# Patient Record
Sex: Female | Born: 1985 | Race: Black or African American | Hispanic: No | Smoking: Former smoker
Health system: Southern US, Community
[De-identification: ages and names within clinical notes are randomized; demographics above are authoritative.]

## PROBLEM LIST (undated history)

## (undated) DIAGNOSIS — D573 Sickle-cell trait: Secondary | ICD-10-CM

## (undated) DIAGNOSIS — R87629 Unspecified abnormal cytological findings in specimens from vagina: Secondary | ICD-10-CM

## (undated) DIAGNOSIS — I82409 Acute embolism and thrombosis of unspecified deep veins of unspecified lower extremity: Secondary | ICD-10-CM

## (undated) HISTORY — PX: MULTIPLE TOOTH EXTRACTIONS: SHX2053

## (undated) HISTORY — DX: Unspecified abnormal cytological findings in specimens from vagina: R87.629

## (undated) HISTORY — PX: WISDOM TOOTH EXTRACTION: SHX21

## (undated) HISTORY — DX: Sickle-cell trait: D57.3

---
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
## 2004-01-13 ENCOUNTER — Inpatient Hospital Stay (HOSPITAL_COMMUNITY): Admission: AD | Admit: 2004-01-13 | Discharge: 2004-01-13 | Payer: Self-pay | Admitting: Obstetrics & Gynecology

## 2004-03-06 ENCOUNTER — Ambulatory Visit (HOSPITAL_COMMUNITY): Admission: RE | Admit: 2004-03-06 | Discharge: 2004-03-06 | Payer: Self-pay | Admitting: *Deleted

## 2004-07-02 ENCOUNTER — Inpatient Hospital Stay (HOSPITAL_COMMUNITY): Admission: AD | Admit: 2004-07-02 | Discharge: 2004-07-02 | Payer: Self-pay | Admitting: *Deleted

## 2004-07-21 ENCOUNTER — Inpatient Hospital Stay (HOSPITAL_COMMUNITY): Admission: AD | Admit: 2004-07-21 | Discharge: 2004-07-21 | Payer: Self-pay | Admitting: Obstetrics & Gynecology

## 2004-07-21 ENCOUNTER — Ambulatory Visit: Payer: Self-pay | Admitting: Family Medicine

## 2004-08-06 ENCOUNTER — Ambulatory Visit: Payer: Self-pay | Admitting: *Deleted

## 2004-08-06 ENCOUNTER — Ambulatory Visit (HOSPITAL_COMMUNITY): Admission: RE | Admit: 2004-08-06 | Discharge: 2004-08-06 | Payer: Self-pay | Admitting: Obstetrics & Gynecology

## 2004-08-08 ENCOUNTER — Ambulatory Visit: Payer: Self-pay | Admitting: *Deleted

## 2004-08-08 ENCOUNTER — Inpatient Hospital Stay (HOSPITAL_COMMUNITY): Admission: AD | Admit: 2004-08-08 | Discharge: 2004-08-12 | Payer: Self-pay | Admitting: Family Medicine

## 2004-08-15 ENCOUNTER — Inpatient Hospital Stay (HOSPITAL_COMMUNITY): Admission: AD | Admit: 2004-08-15 | Discharge: 2004-08-15 | Payer: Self-pay | Admitting: Obstetrics and Gynecology

## 2004-08-18 ENCOUNTER — Inpatient Hospital Stay (HOSPITAL_COMMUNITY): Admission: AD | Admit: 2004-08-18 | Discharge: 2004-08-18 | Payer: Self-pay | Admitting: Obstetrics and Gynecology

## 2004-08-24 ENCOUNTER — Inpatient Hospital Stay (HOSPITAL_COMMUNITY): Admission: AD | Admit: 2004-08-24 | Discharge: 2004-08-24 | Payer: Self-pay | Admitting: *Deleted

## 2005-01-10 ENCOUNTER — Emergency Department (HOSPITAL_COMMUNITY): Admission: EM | Admit: 2005-01-10 | Discharge: 2005-01-10 | Payer: Self-pay | Admitting: Emergency Medicine

## 2005-01-30 ENCOUNTER — Emergency Department (HOSPITAL_COMMUNITY): Admission: EM | Admit: 2005-01-30 | Discharge: 2005-01-30 | Payer: Self-pay | Admitting: Emergency Medicine

## 2006-12-10 ENCOUNTER — Emergency Department (HOSPITAL_COMMUNITY): Admission: EM | Admit: 2006-12-10 | Discharge: 2006-12-10 | Payer: Self-pay | Admitting: Emergency Medicine

## 2006-12-13 ENCOUNTER — Emergency Department (HOSPITAL_COMMUNITY): Admission: EM | Admit: 2006-12-13 | Discharge: 2006-12-13 | Payer: Self-pay | Admitting: Emergency Medicine

## 2007-11-03 ENCOUNTER — Emergency Department (HOSPITAL_COMMUNITY): Admission: EM | Admit: 2007-11-03 | Discharge: 2007-11-03 | Payer: Self-pay | Admitting: Emergency Medicine

## 2007-11-04 ENCOUNTER — Emergency Department (HOSPITAL_COMMUNITY): Admission: EM | Admit: 2007-11-04 | Discharge: 2007-11-04 | Payer: Self-pay | Admitting: Emergency Medicine

## 2008-09-30 ENCOUNTER — Ambulatory Visit: Payer: Self-pay | Admitting: Internal Medicine

## 2008-10-17 ENCOUNTER — Ambulatory Visit: Payer: Self-pay | Admitting: Internal Medicine

## 2008-11-15 ENCOUNTER — Ambulatory Visit: Payer: Self-pay | Admitting: Gastroenterology

## 2009-01-27 ENCOUNTER — Emergency Department (HOSPITAL_COMMUNITY): Admission: EM | Admit: 2009-01-27 | Discharge: 2009-01-27 | Payer: Self-pay | Admitting: Emergency Medicine

## 2009-05-17 ENCOUNTER — Emergency Department (HOSPITAL_COMMUNITY): Admission: EM | Admit: 2009-05-17 | Discharge: 2009-05-17 | Payer: Self-pay | Admitting: Emergency Medicine

## 2009-08-31 ENCOUNTER — Emergency Department (HOSPITAL_COMMUNITY): Admission: EM | Admit: 2009-08-31 | Discharge: 2009-09-01 | Payer: Self-pay | Admitting: Emergency Medicine

## 2010-07-08 LAB — URINALYSIS, ROUTINE W REFLEX MICROSCOPIC
Bilirubin Urine: NEGATIVE
Glucose, UA: NEGATIVE mg/dL
Hgb urine dipstick: NEGATIVE
Ketones, ur: NEGATIVE mg/dL
Nitrite: NEGATIVE
Protein, ur: NEGATIVE mg/dL
Specific Gravity, Urine: 1.015 (ref 1.005–1.030)
Urobilinogen, UA: 1 mg/dL (ref 0.0–1.0)
pH: 6 (ref 5.0–8.0)

## 2010-07-08 LAB — POCT PREGNANCY, URINE: Preg Test, Ur: NEGATIVE

## 2010-09-07 NOTE — Discharge Summary (Signed)
Caitlin Patrick, Caitlin Patrick             ACCOUNT NO.:  [id number]   MEDICAL RECORD NO.:  [id number]          PATIENT TYPE:  INP   LOCATION:  9130                          FACILITY:  [facility]   PHYSICIAN:  Rodolph Bong, M.D.     DATE OF BIRTH:  1985/10/22   DATE OF ADMISSION:  08/08/2004  DATE OF DISCHARGE:  08/12/2004                                 DISCHARGE SUMMARY   DISCHARGE DIAGNOSES:  1.  A 25 year old G1 status post low transverse cesarean section secondary      to arrest of labor.  2.  Viable female infant.   MEDICATIONS:  1.  Ibuprofen 600 mg 1 tablet p.o. q. 6 hours p.r.n.  2.  Percocet 5/325 1 to 2 tablets p.o. q. 6 hours p.r.n. more severe pain.  3.  Depo-Provera injection q. 3 months.  4.  Prenatal vitamins daily.  5.  Ferrous sulfate 325 mg tablets p.o. q.d.   HISTORY OF PRESENT ILLNESS:  Ms. Caitlin Patrick is a 25 year old G1 who presented  at 40-5/[redacted] weeks gestation after noting non-reassuring stress test at [facility] In the Maternity Admissions Unit, an ultrasound was performed that  showed an AFI of 2.4. Consequently, she was admitted for induction of labor  secondary to oligohydramnios.   HOSPITAL COURSE:  Pregnancy:  Ms. Caitlin Patrick was admitted with a history as  outlined above. Initially, her cervix was 140 and high. She was admitted and  placed on cervical ripening therapy. This was attempted with a Foley bulb  and low dose Pitocin. Her contraction pattern picked up and she progressed  in terms of cervical dilation. Due to a slightly elevated blood pressure,  pregnancy induced hypertension labs were checked and are negative. She  continued to be followed expectantly but unfortunately had a prolonged  second stage of labor. Consequently on August 09, 2004 at 2035 secondary to  arrest of labor, she had a low transverse cesarean section. This is dictated  in a separate operative note. She tolerated the procedure well without  complications. In the postpartum period, she did  well. And on the 3rd day,  she was ambulating, tolerating solid foods, and passing flatus. She will be  discharged home for followup at [facility].   DISCHARGE DIET:  Regular.   ACTIVITY:  She is on pelvic rest for 6 weeks.   FOLLOW UP:  Return to [facility] in 6 weeks.   DISCHARGE LABORATORY DATA:  Hemoglobin and hematocrit 8.1 and 24.1 on August 10, 2004.    AK/MEDQ  D:  08/12/2004  T:  08/13/2004  Job:  [id number]

## 2010-09-07 NOTE — Op Note (Signed)
Caitlin Patrick, Caitlin Patrick             ACCOUNT NO.:  [id number]   MEDICAL RECORD NO.:  [id number]          PATIENT TYPE:  INP   LOCATION:  9130                          FACILITY:  [facility]   PHYSICIAN:  Conni Elliot, M.D.DATE OF BIRTH:  1986/01/10   DATE OF PROCEDURE:  08/09/2004  DATE OF DISCHARGE:                                 OPERATIVE REPORT   PREOPERATIVE DIAGNOSIS:  Arrest of labor.   POSTOPERATIVE DIAGNOSIS:  Arrest of labor.   OPERATION:  Low transverse cesarean delivery.   OPERATOR:  Conni Elliot, M.D.   ANESTHESIA:  Continuous lumbar epidural.   OPERATIVE FINDINGS:  A 7 pound 12 ounce female with Apgars of 8 and 8, cord pH  of 7.27.   PROCEDURE:  The patient was placed in the supine left lateral tilt position  and was prepped and draped in a sterile fashion.  A low transverse  Pfannenstiel incision was made through the skin and fascia, the rectus  muscles separated in the midline, the peritoneal cavity entered, a bladder  flap created, a low transverse uterine incision was made.  The baby was  delivered from a vertex presentation with the assistance of a vacuum with  one pull.  The cord doubly clamped and cut, baby handed to the neonatologist  in attendance.  __________.  There was uterine atony, and the patient  received 250 mcg of Hemabate intramuscularly as well as 10 units of Pitocin  intramyometrially.  The uterus, bladder flap, anterior peritoneum, fascia,  subcutaneous tissue and skin were closed in the usual fashion.  Estimated  blood loss approximately 800 mL without replacement.  Sponge, needle and  instrument count was correct.      ASG/MEDQ  D:  08/09/2004  T:  08/10/2004  Job:  [id number]

## 2011-01-17 LAB — URINE MICROSCOPIC-ADD ON

## 2011-01-17 LAB — URINALYSIS, ROUTINE W REFLEX MICROSCOPIC
Bilirubin Urine: NEGATIVE
Glucose, UA: NEGATIVE
Ketones, ur: NEGATIVE
Nitrite: NEGATIVE
Protein, ur: NEGATIVE
Specific Gravity, Urine: 1.015
Urobilinogen, UA: 0.2
pH: 5.5

## 2011-01-17 LAB — PREGNANCY, URINE: Preg Test, Ur: NEGATIVE

## 2011-02-01 LAB — POCT I-STAT CREATININE
Creatinine, Ser: 0.7
Operator id: 151321

## 2011-02-01 LAB — I-STAT 8, (EC8 V) (CONVERTED LAB)
Acid-Base Excess: 2
BUN: 4 — ABNORMAL LOW
Bicarbonate: 23.8
Chloride: 108
Glucose, Bld: 84
HCT: 43
Hemoglobin: 14.6
Operator id: 151321
Potassium: 3.5
Sodium: 138
TCO2: 25
pCO2, Ven: 29.5 — ABNORMAL LOW
pH, Ven: 7.515 — ABNORMAL HIGH

## 2011-02-01 LAB — URINALYSIS, ROUTINE W REFLEX MICROSCOPIC
Bilirubin Urine: NEGATIVE
Glucose, UA: NEGATIVE
Glucose, UA: NEGATIVE
Hgb urine dipstick: NEGATIVE
Ketones, ur: NEGATIVE
Ketones, ur: NEGATIVE
Nitrite: NEGATIVE
Nitrite: POSITIVE — AB
Protein, ur: NEGATIVE
Protein, ur: NEGATIVE
Specific Gravity, Urine: 1.012
Urobilinogen, UA: 1
pH: 6

## 2011-02-01 LAB — RAPID URINE DRUG SCREEN, HOSP PERFORMED
Barbiturates: NOT DETECTED
Cocaine: NOT DETECTED
Opiates: NOT DETECTED
Tetrahydrocannabinol: POSITIVE — AB

## 2011-02-01 LAB — RAPID STREP SCREEN (MED CTR MEBANE ONLY)
Streptococcus, Group A Screen (Direct): NEGATIVE
Streptococcus, Group A Screen (Direct): NEGATIVE

## 2011-02-01 LAB — URINE CULTURE
Colony Count: >=100000
Colony Count: >=100000

## 2011-02-01 LAB — STREP A DNA PROBE: Group A Strep Probe: NEGATIVE

## 2011-02-01 LAB — POCT PREGNANCY, URINE
Operator id: 151321
Preg Test, Ur: NEGATIVE

## 2011-02-01 LAB — URINE MICROSCOPIC-ADD ON

## 2011-02-01 LAB — MONONUCLEOSIS SCREEN: Mono Screen: NEGATIVE

## 2013-02-23 ENCOUNTER — Ambulatory Visit: Payer: Self-pay | Admitting: Advanced Practice Midwife

## 2013-04-23 ENCOUNTER — Encounter: Payer: Self-pay | Admitting: Advanced Practice Midwife

## 2013-04-23 ENCOUNTER — Ambulatory Visit (INDEPENDENT_AMBULATORY_CARE_PROVIDER_SITE_OTHER): Payer: Medicaid Other | Admitting: Advanced Practice Midwife

## 2013-04-23 VITALS — BP 115/75 | HR 78 | Temp 98.0°F | Ht 70.0 in | Wt 220.0 lb

## 2013-04-23 DIAGNOSIS — Z6831 Body mass index (BMI) 31.0-31.9, adult: Secondary | ICD-10-CM

## 2013-04-23 DIAGNOSIS — Z0000 Encounter for general adult medical examination without abnormal findings: Secondary | ICD-10-CM

## 2013-04-23 DIAGNOSIS — Z113 Encounter for screening for infections with a predominantly sexual mode of transmission: Secondary | ICD-10-CM

## 2013-04-23 DIAGNOSIS — Z833 Family history of diabetes mellitus: Secondary | ICD-10-CM

## 2013-04-23 LAB — POCT URINALYSIS DIPSTICK
Bilirubin, UA: NEGATIVE
Blood, UA: NEGATIVE
KETONES UA: NEGATIVE
LEUKOCYTES UA: NEGATIVE
Nitrite, UA: NEGATIVE
Spec Grav, UA: 1.01
UROBILINOGEN UA: NEGATIVE
pH, UA: 7

## 2013-04-23 LAB — COMPREHENSIVE METABOLIC PANEL
ALK PHOS: 65 U/L (ref 39–117)
AST: 11 U/L (ref 0–37)
Albumin: 3.9 g/dL (ref 3.5–5.2)
BILIRUBIN TOTAL: 0.3 mg/dL (ref 0.3–1.2)
BUN: 6 mg/dL (ref 6–23)
CO2: 28 mEq/L (ref 19–32)
CREATININE: 0.74 mg/dL (ref 0.50–1.10)
Calcium: 9 mg/dL (ref 8.4–10.5)
Chloride: 105 mEq/L (ref 96–112)
Glucose, Bld: 79 mg/dL (ref 70–99)
Potassium: 4.3 mEq/L (ref 3.5–5.3)
Sodium: 139 mEq/L (ref 135–145)
Total Protein: 6.6 g/dL (ref 6.0–8.3)

## 2013-04-23 LAB — CBC WITH DIFFERENTIAL/PLATELET
BASOS ABS: 0 10*3/uL (ref 0.0–0.1)
BASOS PCT: 0 % (ref 0–1)
EOS ABS: 0.1 10*3/uL (ref 0.0–0.7)
EOS PCT: 1 % (ref 0–5)
HCT: 33.9 % — ABNORMAL LOW (ref 36.0–46.0)
Hemoglobin: 11.5 g/dL — ABNORMAL LOW (ref 12.0–15.0)
Lymphocytes Relative: 35 % (ref 12–46)
Lymphs Abs: 2.7 10*3/uL (ref 0.7–4.0)
MCH: 28 pg (ref 26.0–34.0)
MCHC: 33.9 g/dL (ref 30.0–36.0)
MCV: 82.7 fL (ref 78.0–100.0)
Monocytes Absolute: 0.5 10*3/uL (ref 0.1–1.0)
Monocytes Relative: 7 % (ref 3–12)
Neutro Abs: 4.5 10*3/uL (ref 1.7–7.7)
Neutrophils Relative %: 57 % (ref 43–77)
PLATELETS: 293 10*3/uL (ref 150–400)
RBC: 4.1 MIL/uL (ref 3.87–5.11)
RDW: 14.1 % (ref 11.5–15.5)
WBC: 7.8 10*3/uL (ref 4.0–10.5)

## 2013-04-23 LAB — TSH: TSH: 0.658 u[IU]/mL (ref 0.350–4.500)

## 2013-04-23 LAB — RPR

## 2013-04-23 LAB — HEMOGLOBIN A1C
Hgb A1c MFr Bld: 5.5 % (ref <5.7)
MEAN PLASMA GLUCOSE: 111 mg/dL (ref <117)

## 2013-04-23 NOTE — Progress Notes (Signed)
Pt in office today for annual exam, would like to have STD testing, reports she has new female partner who has herpes, would like to know what precautions she needs to take so she does not contract herpes

## 2013-04-23 NOTE — Progress Notes (Signed)
  Subjective:     Caitlin Patrick is a 28 y.o. female and is here for a comprehensive physical exam. The patient reports no problems.  Patient reports her partner has HSV. She would like more information r/t to this.   Denies other concerns. Reports she has increased vaginal discharge that is different but denies vaginal itching, burning or irritation.   Patient [facility] year [facility].   History   Social History  . Marital Status: Single    Spouse Name: N/A    Number of Children: N/A  . Years of Education: N/A   Occupational History  . Not on file.   Social History Main Topics  . Smoking status: Former Research scientist (life sciences)  . Smokeless tobacco: Never Used  . Alcohol Use: Yes     Comment: socially wine or beer  . Drug Use: Yes    Special: Marijuana     Comment: occasionally  . Sexual Activity: Yes    Partners: Female   Other Topics Concern  . Not on file   Social History Narrative  . No narrative on file   No health maintenance topics applied.  The following portions of the patient's history were reviewed and updated as appropriate: allergies, current medications, past family history, past medical history, past social history, past surgical history and problem list.  Review of Systems A comprehensive review of systems was negative.   Objective:    BP 115/75  Pulse 78  Temp(Src) 98 F (36.7 C)  Ht 5' 10" (1.778 m)  Wt 220 lb (99.791 kg)  BMI 31.57 kg/m2  LMP 04/12/2013 General appearance: alert and cooperative Head: Normocephalic, without obvious abnormality, atraumatic Ears: normal TM's and external ear canals both ears Nose: Nares normal. Septum midline. Mucosa normal. No drainage or sinus tenderness. Throat: lips, mucosa, and tongue normal; teeth and gums normal Neck: no adenopathy, no carotid bruit, no JVD, supple, symmetrical, trachea midline and thyroid not enlarged, symmetric, no tenderness/mass/nodules Back: symmetric, no curvature.  ROM normal. No CVA tenderness. Lungs: clear to auscultation bilaterally Breasts: normal appearance, no masses or tenderness Heart: regular rate and rhythm, S1, S2 normal, no murmur, click, rub or gallop Abdomen: soft, non-tender; bowel sounds normal; no masses,  no organomegaly Pelvic: cervix normal in appearance, external genitalia normal, no adnexal masses or tenderness, no cervical motion tenderness, rectovaginal septum normal, uterus normal size, shape, and consistency and vagina normal without discharge Extremities: extremities normal, atraumatic, no cyanosis or edema Pulses: 2+ and symmetric Skin: Skin color, texture, turgor normal. No rashes or lesions Lymph nodes: Cervical, supraclavicular, and axillary nodes normal. Neurologic: Grossly normal   Wet Prep: negative hyphae, + whiff, clue cells not seen, negative trich   Assessment:    Healthy female exam.  Abnormal vaginal discharge, Affirm pending Patient Active Problem List   Diagnosis Date Noted  . Family history of diabetes mellitus (DM) 04/23/2013  . BMI 31.0-31.9,adult 04/23/2013  Family Hx of BRCA (mother 53 yo)      Plan:     See After Visit Summary for Counseling Recommendations  Affirm pending, reviewed plan in the event it is +. HSV and STI education done. Encouraged patient to RTC fasting for Cholesterol screening Patient to plan early mammogram due to family hx. HgbA1C today.  Patient due for pap 03/2016  45 min spent with patient greater than 80% spent in counseling and coordination of care.

## 2013-04-24 LAB — HEPATITIS B SURFACE ANTIGEN: HEP B S AG: NEGATIVE

## 2013-04-24 LAB — WET PREP BY MOLECULAR PROBE
Candida species: NEGATIVE
GARDNERELLA VAGINALIS: POSITIVE — AB
Trichomonas vaginosis: NEGATIVE

## 2013-04-24 LAB — HIV ANTIBODY (ROUTINE TESTING W REFLEX): HIV: NONREACTIVE

## 2013-04-25 LAB — GC/CHLAMYDIA PROBE AMP
CT PROBE, AMP APTIMA: NEGATIVE
GC PROBE AMP APTIMA: NEGATIVE

## 2013-05-18 ENCOUNTER — Other Ambulatory Visit: Payer: Self-pay | Admitting: *Deleted

## 2013-05-18 DIAGNOSIS — B9689 Other specified bacterial agents as the cause of diseases classified elsewhere: Secondary | ICD-10-CM

## 2013-05-18 DIAGNOSIS — N760 Acute vaginitis: Principal | ICD-10-CM

## 2013-05-18 MED ORDER — METRONIDAZOLE 500 MG PO TABS: 500.0000 mg | ORAL_TABLET | Freq: Two times a day (BID) | ORAL | Status: DC

## 2013-05-19 ENCOUNTER — Encounter: Payer: Self-pay | Admitting: Advanced Practice Midwife

## 2014-12-06 ENCOUNTER — Encounter (HOSPITAL_COMMUNITY): Payer: Self-pay | Admitting: Emergency Medicine

## 2014-12-06 ENCOUNTER — Emergency Department (HOSPITAL_COMMUNITY)
Admission: EM | Admit: 2014-12-06 | Discharge: 2014-12-06 | Disposition: A | Discharge status: Home / Self Care | Payer: Medicaid Other | Attending: Emergency Medicine | Admitting: Emergency Medicine

## 2014-12-06 DIAGNOSIS — Z91040 Latex allergy status: Secondary | ICD-10-CM | POA: Diagnosis not present

## 2014-12-06 DIAGNOSIS — Z862 Personal history of diseases of the blood and blood-forming organs and certain disorders involving the immune mechanism: Secondary | ICD-10-CM | POA: Insufficient documentation

## 2014-12-06 DIAGNOSIS — Y998 Other external cause status: Secondary | ICD-10-CM | POA: Diagnosis not present

## 2014-12-06 DIAGNOSIS — X58XXXA Exposure to other specified factors, initial encounter: Secondary | ICD-10-CM | POA: Diagnosis not present

## 2014-12-06 DIAGNOSIS — Y9289 Other specified places as the place of occurrence of the external cause: Secondary | ICD-10-CM | POA: Insufficient documentation

## 2014-12-06 DIAGNOSIS — Y9342 Activity, yoga: Secondary | ICD-10-CM | POA: Insufficient documentation

## 2014-12-06 DIAGNOSIS — S199XXA Unspecified injury of neck, initial encounter: Secondary | ICD-10-CM | POA: Insufficient documentation

## 2014-12-06 DIAGNOSIS — S46812A Strain of other muscles, fascia and tendons at shoulder and upper arm level, left arm, initial encounter: Secondary | ICD-10-CM

## 2014-12-06 DIAGNOSIS — S29012A Strain of muscle and tendon of back wall of thorax, initial encounter: Secondary | ICD-10-CM | POA: Diagnosis not present

## 2014-12-06 DIAGNOSIS — Z792 Long term (current) use of antibiotics: Secondary | ICD-10-CM | POA: Diagnosis not present

## 2014-12-06 DIAGNOSIS — S299XXA Unspecified injury of thorax, initial encounter: Secondary | ICD-10-CM | POA: Diagnosis present

## 2014-12-06 DIAGNOSIS — Z87891 Personal history of nicotine dependence: Secondary | ICD-10-CM | POA: Insufficient documentation

## 2014-12-06 MED ORDER — NAPROXEN 500 MG PO TABS: 500.0000 mg | ORAL_TABLET | Freq: Two times a day (BID) | ORAL | Status: DC

## 2014-12-06 MED ORDER — CYCLOBENZAPRINE HCL 10 MG PO TABS: 10.0000 mg | ORAL_TABLET | Freq: Two times a day (BID) | ORAL | Status: DC | PRN

## 2014-12-06 NOTE — ED Notes (Addendum)
Pt reports upper back pain that started 3 days ago after doing yoga. Denies any other fall or injury.  Pt denies radiating pain down either arm or pain in her neck.

## 2014-12-06 NOTE — Discharge Instructions (Signed)
Naprosyn for pain and inflammation. Flexeril for muscle spasms. Try heating pad. Roll out gently. Stretch. Follow up with your doctor if not improving. Return if worsening.   Muscle Strain A muscle strain is an injury that occurs when a muscle is stretched beyond its normal length. Usually a small number of muscle fibers are torn when this happens. Muscle strain is rated in degrees. First-degree strains have the least amount of muscle fiber tearing and pain. Second-degree and third-degree strains have increasingly more tearing and pain.  Usually, recovery from muscle strain takes 1-2 weeks. Complete healing takes 5-6 weeks.  CAUSES  Muscle strain happens when a sudden, violent force placed on a muscle stretches it too far. This may occur with lifting, sports, or a fall.  RISK FACTORS Muscle strain is especially common in athletes.  SIGNS AND SYMPTOMS At the site of the muscle strain, there may be:  Pain.  Bruising.  Swelling.  Difficulty using the muscle due to pain or lack of normal function. DIAGNOSIS  Your health care provider will perform a physical exam and ask about your medical history. TREATMENT  Often, the best treatment for a muscle strain is resting, icing, and applying cold compresses to the injured area.  HOME CARE INSTRUCTIONS   Use the PRICE method of treatment to promote muscle healing during the first 2-3 days after your injury. The PRICE method involves:  Protecting the muscle from being injured again.  Restricting your activity and resting the injured body part.  Icing your injury. To do this, put ice in a plastic bag. Place a towel between your skin and the bag. Then, apply the ice and leave it on from 15-20 minutes each hour. After the third day, switch to moist heat packs.  Apply compression to the injured area with a splint or elastic bandage. Be careful not to wrap it too tightly. This may interfere with blood circulation or increase swelling.  Elevate the  injured body part above the level of your heart as often as you can.  Only take over-the-counter or prescription medicines for pain, discomfort, or fever as directed by your health care provider.  Warming up prior to exercise helps to prevent future muscle strains. SEEK MEDICAL CARE IF:   You have increasing pain or swelling in the injured area.  You have numbness, tingling, or a significant loss of strength in the injured area. MAKE SURE YOU:   Understand these instructions.  Will watch your condition.  Will get help right away if you are not doing well or get worse. Document Released: 04/08/2005 Document Revised: 01/27/2013 Document Reviewed: 11/05/2012 [facility] Patient Information 2015 [place], [place]. This information is not intended to replace advice given to you by your health care provider. Make sure you discuss any questions you have with your health care provider.

## 2014-12-06 NOTE — ED Provider Notes (Signed)
CSN: [id number]     Arrival date & time 12/06/14  1742 History   This chart was scribed for non-physician practitioner Freddie Apley, PA-C working with Leo Grosser, MD by Hilda Lias, ED Scribe. This patient was seen in room WTR6/WTR6 and the patient's care was started at 7:01 PM.  Chief Complaint  Patient presents with  . Back Pain      The history is provided by the patient. No language interpreter was used.     HPI Comments: Caitlin Patrick is a 29 y.o. female who presents to the Emergency Department complaining of constant worsening bilateral upper back pain that has been present for 3 days after pt did yoga twice. Pt states she began doing yoga three days ago, and states she had pain while moving her neck to the left, right, and downward afterwards. Pt states that she did yoga again yesterday and her pain worsened with movement. Pt denies any radiating pain, and denies numbness or weakness. Pt denies taking medications to treat her pain.     Past Medical History  Diagnosis Date  . Sickle cell trait    Past Surgical History  Procedure Laterality Date  . Wisdom tooth extraction    . Multiple tooth extractions Left    Family History  Problem Relation Age of Onset  . Breast cancer Mother   . Hypertension Father   . Diabetes Maternal Grandmother   . Diabetes Paternal Grandmother    Social History  Substance Use Topics  . Smoking status: Former Research scientist (life sciences)  . Smokeless tobacco: Never Used  . Alcohol Use: Yes     Comment: socially wine or beer   OB History    No data available     Review of Systems  Constitutional: Positive for activity change.  Musculoskeletal: Positive for myalgias, back pain and neck pain.  Neurological: Negative for weakness and numbness.      Allergies  Latex  Home Medications   Prior to Admission medications   Medication Sig Start Date End Date Taking? Authorizing Provider  ibuprofen (ADVIL,MOTRIN) 600 MG tablet Take 600 mg by mouth  every 6 (six) hours as needed.    Historical Provider, MD  metroNIDAZOLE (FLAGYL) 500 MG tablet Take 1 tablet (500 mg total) by mouth 2 (two) times daily. 05/18/13   Amy H Wren, CNM   BP 130/81 mmHg  Pulse 75  Temp(Src) 98.9 F (37.2 C) (Oral)  Resp 18  SpO2 96%  LMP 12/05/2014 Physical Exam  Constitutional: She appears well-developed and well-nourished. No distress.  HENT:  Head: Normocephalic.  Eyes: Conjunctivae are normal.  Neck: Neck supple.  No midline cervical spine tenderness. Full range of motion of the neck.  Cardiovascular: Normal rate, regular rhythm and normal heart sounds.   Pulmonary/Chest: Effort normal and breath sounds normal. No respiratory distress. She has no wheezes. She has no rales.  Musculoskeletal: She exhibits no edema.  Tenderness to palpation over left trapezius muscle extending from base of the neck into the left shoulder and down over medial aspect of the scapula. Pain with range of motion of the left arm. No bruising, swelling noted to the area.  Neurological: She is alert.  5/5 and equal strength of upper extremities, specifically, biceps, triceps, deltoids, grip. Sensation is intact and equal bilaterally in upper extremities. Gait is normal.  Skin: Skin is warm and dry.  Psychiatric: She has a normal mood and affect. Her behavior is normal.  Nursing note and vitals reviewed.   ED Course  Procedures (including critical care time)  DIAGNOSTIC STUDIES: Oxygen Saturation is 96% on room air, normal by my interpretation.    COORDINATION OF CARE: 7:05 PM Discussed treatment plan with pt at bedside and pt agreed to plan.   Labs Review Labs Reviewed - No data to display  Imaging Review No results found. .   EKG Interpretation None      MDM   Final diagnoses:  Trapezius strain, left, initial encounter    patient with pain and tenderness to palpation over left trapezius muscle. No bruising, swelling, erythema. Muscle is tender to palpation,  slightly muscular injury. She has no weakness of her extremities. Slightly muscular strain. Plan to discharge home, naproxen, Flexeril, follow up with primary care doctor. Advised heating pads and stretches. Follow with primary care doctor.  Filed Vitals:   12/06/14 1810  BP: 130/81  Pulse: 75  Temp: 98.9 F (37.2 C)  Resp: 18    I personally performed the services described in this documentation, which was scribed in my presence. The recorded information has been reviewed and is accurate.    Jeannett Senior, PA-C 12/06/14 2039  Leo Grosser, MD 12/07/14 [phone]

## 2015-08-07 ENCOUNTER — Emergency Department (HOSPITAL_COMMUNITY)
Admission: EM | Admit: 2015-08-07 | Discharge: 2015-08-07 | Disposition: A | Discharge status: Home / Self Care | Payer: Medicaid Other | Attending: Emergency Medicine | Admitting: Emergency Medicine

## 2015-08-07 ENCOUNTER — Encounter (HOSPITAL_COMMUNITY): Payer: Self-pay

## 2015-08-07 DIAGNOSIS — Z3202 Encounter for pregnancy test, result negative: Secondary | ICD-10-CM | POA: Insufficient documentation

## 2015-08-07 DIAGNOSIS — Z91040 Latex allergy status: Secondary | ICD-10-CM | POA: Insufficient documentation

## 2015-08-07 DIAGNOSIS — Z862 Personal history of diseases of the blood and blood-forming organs and certain disorders involving the immune mechanism: Secondary | ICD-10-CM | POA: Diagnosis not present

## 2015-08-07 DIAGNOSIS — R1012 Left upper quadrant pain: Secondary | ICD-10-CM

## 2015-08-07 DIAGNOSIS — R112 Nausea with vomiting, unspecified: Secondary | ICD-10-CM | POA: Diagnosis not present

## 2015-08-07 DIAGNOSIS — R1084 Generalized abdominal pain: Secondary | ICD-10-CM | POA: Diagnosis not present

## 2015-08-07 DIAGNOSIS — R197 Diarrhea, unspecified: Secondary | ICD-10-CM | POA: Diagnosis not present

## 2015-08-07 DIAGNOSIS — R1032 Left lower quadrant pain: Secondary | ICD-10-CM | POA: Diagnosis present

## 2015-08-07 DIAGNOSIS — Z87891 Personal history of nicotine dependence: Secondary | ICD-10-CM | POA: Insufficient documentation

## 2015-08-07 LAB — I-STAT BETA HCG BLOOD, ED (MC, WL, AP ONLY): I-stat hCG, quantitative: <5 m[IU]/mL (ref <5)

## 2015-08-07 LAB — URINALYSIS, ROUTINE W REFLEX MICROSCOPIC
BILIRUBIN URINE: NEGATIVE
Glucose, UA: NEGATIVE mg/dL
Hgb urine dipstick: NEGATIVE
KETONES UR: NEGATIVE mg/dL
Leukocytes, UA: NEGATIVE
NITRITE: NEGATIVE
PH: 7 (ref 5.0–8.0)
Protein, ur: NEGATIVE mg/dL
Specific Gravity, Urine: 1.015 (ref 1.005–1.030)

## 2015-08-07 LAB — COMPREHENSIVE METABOLIC PANEL
ALBUMIN: 4.3 g/dL (ref 3.5–5.0)
ALK PHOS: 72 U/L (ref 38–126)
ALT: 29 U/L (ref 14–54)
AST: 21 U/L (ref 15–41)
Anion gap: 8 (ref 5–15)
BILIRUBIN TOTAL: 0.4 mg/dL (ref 0.3–1.2)
BUN: 9 mg/dL (ref 6–20)
CALCIUM: 9.1 mg/dL (ref 8.9–10.3)
CO2: 24 mmol/L (ref 22–32)
CREATININE: 0.67 mg/dL (ref 0.44–1.00)
Chloride: 106 mmol/L (ref 101–111)
GFR calc Af Amer: >60 mL/min (ref >60)
GFR calc non Af Amer: >60 mL/min (ref >60)
GLUCOSE: 98 mg/dL (ref 65–99)
Potassium: 3.4 mmol/L — ABNORMAL LOW (ref 3.5–5.1)
Sodium: 138 mmol/L (ref 135–145)
TOTAL PROTEIN: 8 g/dL (ref 6.5–8.1)

## 2015-08-07 LAB — CBC
HEMATOCRIT: 34.8 % — AB (ref 36.0–46.0)
Hemoglobin: 12.2 g/dL (ref 12.0–15.0)
MCH: 28.5 pg (ref 26.0–34.0)
MCHC: 35.1 g/dL (ref 30.0–36.0)
MCV: 81.3 fL (ref 78.0–100.0)
Platelets: 276 10*3/uL (ref 150–400)
RBC: 4.28 MIL/uL (ref 3.87–5.11)
RDW: 13.7 % (ref 11.5–15.5)
WBC: 8.1 10*3/uL (ref 4.0–10.5)

## 2015-08-07 LAB — LIPASE, BLOOD: Lipase: 22 U/L (ref 11–51)

## 2015-08-07 MED ORDER — SODIUM CHLORIDE 0.9 % IV BOLUS (SEPSIS)
1000.0000 mL | Freq: Once | INTRAVENOUS | Status: AC
  Administered 2015-08-07: 1000 mL via INTRAVENOUS

## 2015-08-07 MED ORDER — ONDANSETRON HCL 4 MG/2ML IJ SOLN
4.0000 mg | Freq: Once | INTRAMUSCULAR | Status: AC
  Administered 2015-08-07: 4 mg via INTRAVENOUS
  Filled 2015-08-07: qty 2

## 2015-08-07 MED ORDER — GI COCKTAIL ~~LOC~~
30.0000 mL | Freq: Once | ORAL | Status: AC
  Administered 2015-08-07: 30 mL via ORAL
  Filled 2015-08-07: qty 30

## 2015-08-07 NOTE — ED Notes (Signed)
Pt c/o intermittent abdominal pain x 6 month increasing x 2-3 days and n/v/d x 2-3 days.  Pain score 7/10.  Pt reports that pain increases w/ eating.

## 2015-08-07 NOTE — ED Notes (Signed)
PT is aware of urine sample 

## 2015-08-07 NOTE — Discharge Instructions (Signed)
Try Zantac 150 mg twice a day for your pain. Follow-up with your family doctor. Since her symptoms be going on for so long they may need you to see a gastroenterologist Abdominal Pain, Adult Many things can cause abdominal pain. Usually, abdominal pain is not caused by a disease and will improve without treatment. It can often be observed and treated at home. Your health care provider will do a physical exam and possibly order blood tests and X-rays to help determine the seriousness of your pain. However, in many cases, more time must pass before a clear cause of the pain can be found. Before that point, your health care provider may not know if you need more testing or further treatment. HOME CARE INSTRUCTIONS Monitor your abdominal pain for any changes. The following actions may help to alleviate any discomfort you are experiencing:  Only take over-the-counter or prescription medicines as directed by your health care provider.  Do not take laxatives unless directed to do so by your health care provider.  Try a clear liquid diet (broth, tea, or water) as directed by your health care provider. Slowly move to a bland diet as tolerated. SEEK MEDICAL CARE IF:  You have unexplained abdominal pain.  You have abdominal pain associated with nausea or diarrhea.  You have pain when you urinate or have a bowel movement.  You experience abdominal pain that wakes you in the night.  You have abdominal pain that is worsened or improved by eating food.  You have abdominal pain that is worsened with eating fatty foods.  You have a fever. SEEK IMMEDIATE MEDICAL CARE IF:  Your pain does not go away within 2 hours.  You keep throwing up (vomiting).  Your pain is felt only in portions of the abdomen, such as the right side or the left lower portion of the abdomen.  You pass bloody or black tarry stools. MAKE SURE YOU:  Understand these instructions.  Will watch your condition.  Will get help  right away if you are not doing well or get worse.   This information is not intended to replace advice given to you by your health care provider. Make sure you discuss any questions you have with your health care provider.   Document Released: 01/16/2005 Document Revised: 12/28/2014 Document Reviewed: 12/16/2012 Elsevier Interactive Patient Education [employer].

## 2015-08-07 NOTE — ED Provider Notes (Signed)
CSN: [id number]     Arrival date & time 08/07/15  0831 History   First MD Initiated Contact with Patient 08/07/15 [phone]     Chief Complaint  Patient presents with  . Abdominal Pain  . Emesis  . Diarrhea     (Consider location/radiation/quality/duration/timing/severity/associated sxs/prior Treatment) Patient is a 30 y.o. female presenting with abdominal pain. The history is provided by the patient.  Abdominal Pain Pain location:  LLQ Pain quality: cramping, sharp and shooting   Pain radiates to:  Does not radiate Pain severity:  Moderate Onset quality:  Gradual Duration:  24 weeks Timing:  Constant Progression:  Worsening Chronicity:  New Context: alcohol use and eating   Relieved by:  Nothing Worsened by:  Nothing tried Ineffective treatments:  None tried Associated symptoms: nausea and vomiting   Associated symptoms: no chest pain, no chills, no dysuria, no fever and no shortness of breath    30 yo F With a chief complaint of left lower quadrant pain. This been going on for about 6 months. Crampy comes and goes also described as sharp and severe. Patient has had some nausea vomiting and diarrhea with this as well. She's been feeling more constipated over the past week. Denies any vaginal bleeding vaginal discharge. Denies dysuria. Patient points the pain it's about the left upper quadrant. Pain is worse with eating and drinking alcohol.  Past Medical History  Diagnosis Date  . Sickle cell trait [facility])    Past Surgical History  Procedure Laterality Date  . Wisdom tooth extraction    . Multiple tooth extractions Left   . Cesarean section     Family History  Problem Relation Age of Onset  . Breast cancer Mother   . Hypertension Father   . Diabetes Maternal Grandmother   . Diabetes Paternal Grandmother    Social History  Substance Use Topics  . Smoking status: Former Research scientist (life sciences)  . Smokeless tobacco: Never Used  . Alcohol Use: Yes     Comment: socially wine or beer   OB  History    No data available     Review of Systems  Constitutional: Negative for fever and chills.  HENT: Negative for congestion and rhinorrhea.   Eyes: Negative for redness and visual disturbance.  Respiratory: Negative for shortness of breath and wheezing.   Cardiovascular: Negative for chest pain and palpitations.  Gastrointestinal: Positive for nausea, vomiting and abdominal pain.  Genitourinary: Negative for dysuria and urgency.  Musculoskeletal: Negative for myalgias and arthralgias.  Skin: Negative for pallor and wound.  Neurological: Negative for dizziness and headaches.      Allergies  Latex  Home Medications   Prior to Admission medications   Medication Sig Start Date End Date Taking? Authorizing Provider  bismuth subsalicylate (PEPTO BISMOL) 262 MG chewable tablet Chew 262 mg by mouth as needed for indigestion.   Yes Historical Provider, MD  ibuprofen (ADVIL,MOTRIN) 200 MG tablet Take 200 mg by mouth every 6 (six) hours as needed for headache, moderate pain or cramping.   Yes Historical Provider, MD  naproxen (NAPROSYN) 500 MG tablet Take 1 tablet (500 mg total) by mouth 2 (two) times daily. Patient taking differently: Take 500 mg by mouth 2 (two) times daily as needed for mild pain or moderate pain.  12/06/14  Yes Tatyana Kirichenko, PA-C   BP 123/90 mmHg  Pulse 58  Temp(Src) 98.2 F (36.8 C) (Oral)  Resp 17  SpO2 100%  LMP 07/31/2015 Physical Exam  Constitutional: She is oriented to  person, place, and time. She appears well-developed and well-nourished. No distress.  HENT:  Head: Normocephalic and atraumatic.  Eyes: EOM are normal. Pupils are equal, round, and reactive to light.  Neck: Normal range of motion. Neck supple.  Cardiovascular: Normal rate and regular rhythm.  Exam reveals no gallop and no friction rub.   No murmur heard. Pulmonary/Chest: Effort normal. She has no wheezes. She has no rales.  Abdominal: Soft. She exhibits no distension. There is  tenderness (mild diffuse, negative muprhys). There is no rebound and no guarding.  Musculoskeletal: She exhibits no edema or tenderness.  Neurological: She is alert and oriented to person, place, and time.  Skin: Skin is warm and dry. She is not diaphoretic.  Psychiatric: She has a normal mood and affect. Her behavior is normal.  Nursing note and vitals reviewed.   ED Course  Procedures (including critical care time) Labs Review Labs Reviewed  COMPREHENSIVE METABOLIC PANEL - Abnormal; Notable for the following:    Potassium 3.4 (*)    All other components within normal limits  CBC - Abnormal; Notable for the following:    HCT 34.8 (*)    All other components within normal limits  URINALYSIS, ROUTINE W REFLEX MICROSCOPIC (NOT AT [facility]) - Abnormal; Notable for the following:    APPearance CLOUDY (*)    All other components within normal limits  LIPASE, BLOOD  I-STAT BETA HCG BLOOD, ED ([facility], [facility], [facility] ONLY)    Imaging Review No results found. I have personally reviewed and evaluated these images and lab results as part of my medical decision-making.   EKG Interpretation None      MDM   Final diagnoses:  Left upper quadrant pain    30 yo F with a chief complaint of abdominal pain. Patient symptoms are consistent with GERD versus constipation. Patient has a nonfocal abdomen feel that imaging would not be helpful at this time. Will obtain a laboratory evaluation given a fluid bolus Zofran GI cocktail reassess.  Lab work unremarkable.  D/c home.    I have discussed the diagnosis/risks/treatment options with the patient and family and believe the pt to be eligible for discharge home to follow-up with PCP. We also discussed returning to the ED immediately if new or worsening sx occur. We discussed the sx which are most concerning (e.g., sudden worsening pain, fever, inability to tolerate by mouth) that necessitate immediate return. Medications administered to the patient during their  visit and any new prescriptions provided to the patient are listed below.  Medications given during this visit Medications  sodium chloride 0.9 % bolus 1,000 mL (0 mLs Intravenous Stopped 08/07/15 1110)  ondansetron (ZOFRAN) injection 4 mg (4 mg Intravenous Given 08/07/15 0951)  gi cocktail (Maalox,Lidocaine,Donnatal) (30 mLs Oral Given 08/07/15 0951)    Discharge Medication List as of 08/07/2015 10:58 AM      The patient appears reasonably screen and/or stabilized for discharge and I doubt any other medical condition or other [facility] requiring further screening, evaluation, or treatment in the ED at this time prior to discharge.    Deno Etienne, DO 08/08/15 [phone]

## 2016-03-21 ENCOUNTER — Ambulatory Visit: Payer: Medicaid Other | Admitting: Certified Nurse Midwife

## 2016-07-15 DIAGNOSIS — G43109 Migraine with aura, not intractable, without status migrainosus: Secondary | ICD-10-CM | POA: Insufficient documentation

## 2016-08-17 ENCOUNTER — Encounter (HOSPITAL_COMMUNITY): Payer: Self-pay | Admitting: *Deleted

## 2016-08-17 ENCOUNTER — Emergency Department (HOSPITAL_COMMUNITY)
Admission: EM | Admit: 2016-08-17 | Discharge: 2016-08-17 | Disposition: A | Discharge status: Home / Self Care | Payer: Medicaid Other | Attending: Emergency Medicine | Admitting: Emergency Medicine

## 2016-08-17 DIAGNOSIS — Z79899 Other long term (current) drug therapy: Secondary | ICD-10-CM | POA: Diagnosis not present

## 2016-08-17 DIAGNOSIS — R21 Rash and other nonspecific skin eruption: Secondary | ICD-10-CM | POA: Insufficient documentation

## 2016-08-17 DIAGNOSIS — Z91040 Latex allergy status: Secondary | ICD-10-CM | POA: Diagnosis not present

## 2016-08-17 DIAGNOSIS — Z87891 Personal history of nicotine dependence: Secondary | ICD-10-CM | POA: Diagnosis not present

## 2016-08-17 MED ORDER — PREDNISONE 20 MG PO TABS: 40.0000 mg | ORAL_TABLET | Freq: Every day | ORAL | 0 refills | Status: DC

## 2016-08-17 NOTE — ED Provider Notes (Signed)
[facility] DEPT Provider Note   CSN: [id number] Arrival date & time: 08/17/16  1740 By signing my name below, I, Gaspar Cola, attest that this documentation has been prepared under the direction and in the presence of non physician provider,Stevee Valenta, PA-C. Electronically Signed: Gaspar Cola, Scribe. 08/17/2016. 6:50 PM  History   Chief Complaint Chief Complaint  Patient presents with  . Rash   HPI Caitlin Patrick is a 31 y.o. female who presents to the Emergency Department complaining of a moderate, gradually worsening, erythematous rash onset yesterday. Per pt, her rash began on her left lateral arm and spread to her right arm, back, chest and face. She notes associated mild itching that is exacerbated by scratching the area.  No OTC medications or ointments attempted. No new soaps, lotions, detergents, foods, animals, plants, or medications .Pt denies any drainage, vomiting, fever, difficulty breathing, chest pain, oral lesions, or abdominal pain.  The history is provided by the patient. No language interpreter was used.   Past Medical History:  Diagnosis Date  . Sickle cell trait [facility])     Patient Active Problem List   Diagnosis Date Noted  . Family history of diabetes mellitus (DM) 04/23/2013  . BMI 31.0-31.9,adult 04/23/2013    Past Surgical History:  Procedure Laterality Date  . CESAREAN SECTION    . MULTIPLE TOOTH EXTRACTIONS Left   . WISDOM TOOTH EXTRACTION      OB History    No data available     Home Medications    Prior to Admission medications   Medication Sig Start Date End Date Taking? Authorizing Provider  bismuth subsalicylate (PEPTO BISMOL) 262 MG chewable tablet Chew 262 mg by mouth as needed for indigestion.    Historical Provider, MD  ibuprofen (ADVIL,MOTRIN) 200 MG tablet Take 200 mg by mouth every 6 (six) hours as needed for headache, moderate pain or cramping.    Historical Provider, MD  naproxen (NAPROSYN) 500 MG tablet Take 1  tablet (500 mg total) by mouth 2 (two) times daily. Patient taking differently: Take 500 mg by mouth 2 (two) times daily as needed for mild pain or moderate pain.  12/06/14   Tatyana Kirichenko, PA-C  predniSONE (DELTASONE) 20 MG tablet Take 2 tablets (40 mg total) by mouth daily. 08/17/16   Nona Dell, PA-C   Family History Family History  Problem Relation Age of Onset  . Breast cancer Mother   . Hypertension Father   . Diabetes Maternal Grandmother   . Diabetes Paternal Grandmother     Social History Social History  Substance Use Topics  . Smoking status: Former Research scientist (life sciences)  . Smokeless tobacco: Never Used  . Alcohol use Yes     Comment: socially wine or beer    Allergies   Latex  Review of Systems Review of Systems  Constitutional: Negative for fever.  Respiratory: Negative for shortness of breath.   Cardiovascular: Negative for chest pain.  Gastrointestinal: Negative for vomiting.  Skin: Positive for color change and rash.   Physical Exam Updated Vital Signs BP (!) 123/94   Pulse 97   Temp 98.7 F (37.1 C) (Oral)   Resp 18   LMP 08/10/2016   SpO2 100%   Physical Exam  Constitutional: She is oriented to person, place, and time. She appears well-developed and well-nourished. No distress.  HENT:  Head: Normocephalic and atraumatic.  Mouth/Throat: Uvula is midline, oropharynx is clear and moist and mucous membranes are normal. No oropharyngeal exudate, posterior oropharyngeal edema, posterior oropharyngeal  erythema or tonsillar abscesses. No tonsillar exudate.  No oral lesions   Eyes: Conjunctivae and EOM are normal. Right eye exhibits no discharge. Left eye exhibits no discharge. No scleral icterus.  Neck: Normal range of motion. Neck supple.  Cardiovascular: Normal rate, regular rhythm, normal heart sounds and intact distal pulses.   Pulmonary/Chest: Effort normal and breath sounds normal. No respiratory distress. She has no wheezes. She has no rales. She  exhibits no tenderness.  Abdominal: Soft. Bowel sounds are normal. She exhibits no distension and no mass. There is no tenderness. There is no rebound and no guarding.  Musculoskeletal: Normal range of motion. She exhibits no edema or tenderness.  Lymphadenopathy:    She has no cervical adenopathy.  Neurological: She is alert and oriented to person, place, and time.  Skin: Skin is warm and dry. Rash noted. She is not diaphoretic.  Multiple small scattered erythemetous blanching papules present to arms, back, and upper chest. No vesicles, pustules, bulla, or drainage. No lesions on palms or soles.  Nursing note and vitals reviewed.  ED Treatments / Results   DIAGNOSTIC STUDIES:  Oxygen Saturation is 100% RA, normal by my interpretation.    COORDINATION OF CARE:  6:36 PM Discussed treatment plan with pt at bedside and pt agreed to plan.  Labs (all labs ordered are listed, but only abnormal results are displayed) Labs Reviewed - No data to display  EKG  EKG Interpretation None       Radiology No results found.  Procedures Procedures (including critical care time)  Medications Ordered in ED Medications - No data to display   Initial Impression / Assessment and Plan / ED Course  I have reviewed the triage vital signs and the nursing notes.  Pertinent labs & imaging results that were available during my care of the patient were reviewed by me and considered in my medical decision making (see chart for details).     Rash consistent with mosquito bites vs contact dermatitis. Patient denies any difficulty breathing or swallowing.  Pt has a patent airway without stridor and is handling secretions without difficulty; no angioedema. No blisters, no pustules, no warmth, no draining sinus tracts, no superficial abscesses, no bullous impetigo, no vesicles, no desquamation, no target lesions with dusky purpura or a central bulla. Not tender to touch. No concern for superimposed  infection. No concern for SJS, TEN, TSS, tick borne illness, syphilis or other life-threatening condition. Will discharge home with short course of steroids. Advised to followup with PCP as needed. Discussed return precautions.     Final Clinical Impressions(s) / ED Diagnoses   Final diagnoses:  Rash    New Prescriptions Discharge Medication List as of 08/17/2016  6:42 PM    START taking these medications   Details  predniSONE (DELTASONE) 20 MG tablet Take 2 tablets (40 mg total) by mouth daily., Starting Sat 08/17/2016, Print        I personally performed the services described in this documentation, which was scribed in my presence. The recorded information has been reviewed and is accurate.     Chesley Noon [place], [place] 08/17/16 1854    Daleen Bo, MD 08/18/16 1229

## 2016-08-17 NOTE — ED Notes (Signed)
Declined W/C at D/C and was escorted to lobby by RN. 

## 2016-08-17 NOTE — Discharge Instructions (Signed)
Take your medication as prescribed until completed. You may also apply over-the-counter hydrocortisone cream to your rash as needed for additional itch relief. I recommend following up with your primary care provider in the next 4-5 days if your rash has not improved. Please return to the Emergency Department if symptoms worsen or new onset of fever, oral lesions, difficulty breathing, facial swelling, drainage, new rash, spreading of rash, fluid filled lesions, abdominal pain, vomiting.

## 2016-08-17 NOTE — ED Triage Notes (Signed)
The pt has had a rash since last pm that started on  Her lt upper arm.  No previous history.  No difficulty breathing  lmp last week

## 2016-09-09 ENCOUNTER — Ambulatory Visit: Payer: Medicaid Other | Admitting: Obstetrics and Gynecology

## 2016-09-18 ENCOUNTER — Emergency Department (HOSPITAL_COMMUNITY)
Admission: EM | Admit: 2016-09-18 | Discharge: 2016-09-18 | Disposition: A | Discharge status: Home / Self Care | Payer: Medicaid Other | Attending: Emergency Medicine | Admitting: Emergency Medicine

## 2016-09-18 ENCOUNTER — Encounter (HOSPITAL_COMMUNITY): Payer: Self-pay | Admitting: Emergency Medicine

## 2016-09-18 DIAGNOSIS — Z87891 Personal history of nicotine dependence: Secondary | ICD-10-CM | POA: Insufficient documentation

## 2016-09-18 DIAGNOSIS — R197 Diarrhea, unspecified: Secondary | ICD-10-CM | POA: Diagnosis not present

## 2016-09-18 DIAGNOSIS — R112 Nausea with vomiting, unspecified: Secondary | ICD-10-CM | POA: Insufficient documentation

## 2016-09-18 DIAGNOSIS — Z79899 Other long term (current) drug therapy: Secondary | ICD-10-CM | POA: Insufficient documentation

## 2016-09-18 LAB — CBC
HEMATOCRIT: 37.2 % (ref 36.0–46.0)
HEMOGLOBIN: 13 g/dL (ref 12.0–15.0)
MCH: 29.2 pg (ref 26.0–34.0)
MCHC: 34.9 g/dL (ref 30.0–36.0)
MCV: 83.6 fL (ref 78.0–100.0)
Platelets: 253 10*3/uL (ref 150–400)
RBC: 4.45 MIL/uL (ref 3.87–5.11)
RDW: 13.3 % (ref 11.5–15.5)
WBC: 8.2 10*3/uL (ref 4.0–10.5)

## 2016-09-18 LAB — URINALYSIS, ROUTINE W REFLEX MICROSCOPIC
Bilirubin Urine: NEGATIVE
Glucose, UA: NEGATIVE mg/dL
Hgb urine dipstick: NEGATIVE
Ketones, ur: NEGATIVE mg/dL
LEUKOCYTES UA: NEGATIVE
Nitrite: NEGATIVE
PH: 6 (ref 5.0–8.0)
Protein, ur: NEGATIVE mg/dL
SPECIFIC GRAVITY, URINE: 1.012 (ref 1.005–1.030)

## 2016-09-18 LAB — COMPREHENSIVE METABOLIC PANEL
ALT: 15 U/L (ref 14–54)
ANION GAP: 6 (ref 5–15)
AST: 18 U/L (ref 15–41)
Albumin: 4.3 g/dL (ref 3.5–5.0)
Alkaline Phosphatase: 55 U/L (ref 38–126)
BILIRUBIN TOTAL: 0.9 mg/dL (ref 0.3–1.2)
BUN: 8 mg/dL (ref 6–20)
CO2: 26 mmol/L (ref 22–32)
Calcium: 8.9 mg/dL (ref 8.9–10.3)
Chloride: 107 mmol/L (ref 101–111)
Creatinine, Ser: 0.66 mg/dL (ref 0.44–1.00)
GFR calc Af Amer: >60 mL/min (ref >60)
Glucose, Bld: 106 mg/dL — ABNORMAL HIGH (ref 65–99)
Potassium: 3.7 mmol/L (ref 3.5–5.1)
Sodium: 139 mmol/L (ref 135–145)
TOTAL PROTEIN: 8 g/dL (ref 6.5–8.1)

## 2016-09-18 LAB — PREGNANCY, URINE: Preg Test, Ur: NEGATIVE

## 2016-09-18 LAB — LIPASE, BLOOD: Lipase: 22 U/L (ref 11–51)

## 2016-09-18 MED ORDER — PROMETHAZINE HCL 25 MG PO TABS: 25.0000 mg | ORAL_TABLET | Freq: Four times a day (QID) | ORAL | 0 refills | Status: DC | PRN

## 2016-09-18 MED ORDER — SODIUM CHLORIDE 0.9 % IV BOLUS (SEPSIS)
1000.0000 mL | Freq: Once | INTRAVENOUS | Status: AC
  Administered 2016-09-18: 1000 mL via INTRAVENOUS

## 2016-09-18 MED ORDER — ONDANSETRON HCL 4 MG/2ML IJ SOLN
4.0000 mg | Freq: Once | INTRAMUSCULAR | Status: AC
  Administered 2016-09-18: 4 mg via INTRAVENOUS
  Filled 2016-09-18: qty 2

## 2016-09-18 NOTE — ED Provider Notes (Signed)
[facility] DEPT Provider Note   CSN: [id number] Arrival date & time: 09/18/16  1131     History   Chief Complaint Chief Complaint  Patient presents with  . Emesis    HPI Caitlin Patrick is a 31 y.o. female.  HPI   31 year old obese female with history of sickle cell trait presenting today complaining of nausea and vomiting. Patient reports she ate leftover [employer] cookout food last night and now afterward she developed abdominal cramping. Since then she has had multiple bouts of nonbloody nonbilious vomiting this including nonbloody non-mucousy diarrhea. States that she has to use the bathroom hourly. Aside from mild abdominal cramping, patient states she cannot tolerate anything by mouth. She denies fever, chills, headache, URI symptoms, chest pain, short of breath, back pain, dysuria, hematuria, vaginal bleeding or vaginal discharge. No recent travel, no recent antibody use, no recent sick contact.     Past Medical History:  Diagnosis Date  . Sickle cell trait [facility])     Patient Active Problem List   Diagnosis Date Noted  . Family history of diabetes mellitus (DM) 04/23/2013  . BMI 31.0-31.9,adult 04/23/2013    Past Surgical History:  Procedure Laterality Date  . CESAREAN SECTION    . MULTIPLE TOOTH EXTRACTIONS Left   . WISDOM TOOTH EXTRACTION      OB History    No data available       Home Medications    Prior to Admission medications   Medication Sig Start Date End Date Taking? Authorizing Provider  bismuth subsalicylate (PEPTO BISMOL) 262 MG chewable tablet Chew 262 mg by mouth as needed for indigestion.   Yes [provider]  ibuprofen (ADVIL,MOTRIN) 200 MG tablet Take 200 mg by mouth every 6 (six) hours as needed for headache, moderate pain or cramping.   Yes [provider]  naproxen (NAPROSYN) 500 MG tablet Take 1 tablet (500 mg total) by mouth 2 (two) times daily. Patient not taking: Reported on 09/18/2016 12/06/14   Jeannett Senior, PA-C  predniSONE (DELTASONE) 20 MG tablet Take 2 tablets (40 mg total) by mouth daily. Patient not taking: Reported on 09/18/2016 08/17/16   Nona Dell, PA-C    Family History Family History  Problem Relation Age of Onset  . Breast cancer Mother   . Hypertension Father   . Diabetes Maternal Grandmother   . Diabetes Paternal Grandmother     Social History Social History  Substance Use Topics  . Smoking status: Former Research scientist (life sciences)  . Smokeless tobacco: Never Used  . Alcohol use Yes     Comment: socially wine or beer     Allergies   Latex   Review of Systems Review of Systems  All other systems reviewed and are negative.    Physical Exam Updated Vital Signs BP 122/85 (BP Location: Right Arm)   Pulse (!) 103   Temp 98.6 F (37 C) (Oral)   Resp 12   LMP 09/05/2016 (Exact Date)   SpO2 100%   Physical Exam  Constitutional: She appears well-developed and well-nourished. No distress.  HENT:  Head: Atraumatic.  Eyes: Conjunctivae are normal.  Neck: Neck supple.  Cardiovascular: Normal rate and regular rhythm.   Pulmonary/Chest: Effort normal and breath sounds normal.  Abdominal: Soft. Bowel sounds are normal. She exhibits no distension. There is tenderness (Mild epigastric tenderness without guarding or rebound tenderness. Negative Murphy sign, no pain at McBurney's point.).  Neurological: She is alert.  Skin: No rash noted.  Psychiatric: She has a  normal mood and affect.  Nursing note and vitals reviewed.    ED Treatments / Results  Labs (all labs ordered are listed, but only abnormal results are displayed) Labs Reviewed  COMPREHENSIVE METABOLIC PANEL - Abnormal; Notable for the following:       Result Value   Glucose, Bld 106 (*)    All other components within normal limits  LIPASE, BLOOD  CBC  URINALYSIS, ROUTINE W REFLEX MICROSCOPIC  PREGNANCY, URINE  POC URINE PREG, ED    EKG  EKG Interpretation None       Radiology No  results found.  Procedures Procedures (including critical care time)  Medications Ordered in ED Medications  sodium chloride 0.9 % bolus 1,000 mL (1,000 mLs Intravenous New Bag/Given 09/18/16 1341)  ondansetron (ZOFRAN) injection 4 mg (4 mg Intravenous Given 09/18/16 1341)     Initial Impression / Assessment and Plan / ED Course  I have reviewed the triage vital signs and the nursing notes.  Pertinent labs & imaging results that were available during my care of the patient were reviewed by me and considered in my medical decision making (see chart for details).     BP 122/85 (BP Location: Right Arm)   Pulse (!) 103   Temp 98.6 F (37 C) (Oral)   Resp 12   LMP 09/05/2016 (Exact Date)   SpO2 100%    Final Clinical Impressions(s) / ED Diagnoses   Final diagnoses:  Nausea vomiting and diarrhea    New Prescriptions New Prescriptions   PROMETHAZINE (PHENERGAN) 25 MG TABLET    Take 1 tablet (25 mg total) by mouth every 6 (six) hours as needed for nausea or vomiting.   1:48 PM Patient here with nausea vomiting diarrhea after eating leftover food. She does have some mild epigastric abdominal pain on exam however the remainder of her examination is unremarkable. She is well-appearing appears to be in no acute discomfort. She is afebrile. Labs are reassuring. Suspect viral etiology causing his symptoms. We'll treat his symptoms with IV fluid and antinausea medication. I have low suspicion for biliary disease, appendicitis, or other acute abdominal pathology at this time.  2:53 PM Patient resting comfortably in bed, sleeping, easily arousable. She felt better after receiving treatment. Will check to see if she can tolerates by mouth challenge.  3:51 PM Pt able to eat and drink.  Stable for discharge.  Return precaution given.    Domenic Moras, PA-C 09/18/16 1552    Lajean Saver, MD 09/19/16 [phone]

## 2016-09-18 NOTE — ED Notes (Signed)
Pt states she is feeling a little bit better and was able to hold down the little bit she ate.

## 2016-09-18 NOTE — ED Triage Notes (Signed)
Pt with vomiting since 2 am, approximately 6 times. Denies fever, c/o abdominal cramping.

## 2016-09-18 NOTE — ED Notes (Signed)
Pt has been given saltine crackers, peanut butter and regular sprite to drink. Pt states she does not have much of an appetite. She did eat a few crackers, does not want the peanut butter and the sprite is doing very well.

## 2016-09-18 NOTE — ED Notes (Signed)
Pt ambulatory and independent at discharge.  Verbalized understanding of discharge instructions 

## 2016-09-21 ENCOUNTER — Emergency Department (HOSPITAL_COMMUNITY)
Admission: EM | Admit: 2016-09-21 | Discharge: 2016-09-21 | Disposition: A | Discharge status: Home / Self Care | Payer: Medicaid Other | Attending: Emergency Medicine | Admitting: Emergency Medicine

## 2016-09-21 ENCOUNTER — Encounter (HOSPITAL_COMMUNITY): Payer: Self-pay | Admitting: Emergency Medicine

## 2016-09-21 DIAGNOSIS — Z91040 Latex allergy status: Secondary | ICD-10-CM | POA: Diagnosis not present

## 2016-09-21 DIAGNOSIS — J029 Acute pharyngitis, unspecified: Secondary | ICD-10-CM | POA: Diagnosis present

## 2016-09-21 DIAGNOSIS — Z87891 Personal history of nicotine dependence: Secondary | ICD-10-CM | POA: Diagnosis not present

## 2016-09-21 DIAGNOSIS — J020 Streptococcal pharyngitis: Secondary | ICD-10-CM | POA: Diagnosis not present

## 2016-09-21 DIAGNOSIS — Z79899 Other long term (current) drug therapy: Secondary | ICD-10-CM | POA: Insufficient documentation

## 2016-09-21 LAB — RAPID STREP SCREEN (MED CTR MEBANE ONLY): STREPTOCOCCUS, GROUP A SCREEN (DIRECT): POSITIVE — AB

## 2016-09-21 MED ORDER — ACETAMINOPHEN 500 MG PO TABS
1000.0000 mg | ORAL_TABLET | Freq: Once | ORAL | Status: AC
  Administered 2016-09-21: 1000 mg via ORAL
  Filled 2016-09-21: qty 2

## 2016-09-21 MED ORDER — AMOXICILLIN 500 MG PO CAPS: 500.0000 mg | ORAL_CAPSULE | Freq: Two times a day (BID) | ORAL | 0 refills | Status: DC

## 2016-09-21 NOTE — ED Provider Notes (Signed)
[facility] DEPT Provider Note   CSN: [id number] Arrival date & time: 09/21/16  1214   By signing my name below, I, Evelene Croon, attest that this documentation has been prepared under the direction and in the presence of  [place] Lanius, [place]. Electronically Signed: Evelene Croon, Scribe. 09/21/2016. 1:14 PM.   History   Chief Complaint Chief Complaint  Patient presents with  . Sore Throat    The history is provided by the patient. No language interpreter was used.    HPI Comments:  Caitlin Patrick is a 31 y.o. female who presents to the Emergency Department complaining of unchanged sore throat x 2 days. She notes the majority of her pain to the right throat. Her pain is worse when she swallows but she is able to tolerate her secretions without difficulty. New difficulty tolerating by mouth and no difficulty swelling. No recent sick contacts. No alleviating factors noted; no treatments tried PTA.  Pt smokes marijuana; last smoked last night. She denies cigarette smoking. She also denies fever, and SOB. She denies any recent history of oral sex. Pt states she had nausea and vomiting earlier this week prior to onset of sorer throat that has resolved at this time.   Past Medical History:  Diagnosis Date  . Sickle cell trait [facility])     Patient Active Problem List   Diagnosis Date Noted  . Family history of diabetes mellitus (DM) 04/23/2013  . BMI 31.0-31.9,adult 04/23/2013    Past Surgical History:  Procedure Laterality Date  . CESAREAN SECTION    . MULTIPLE TOOTH EXTRACTIONS Left   . WISDOM TOOTH EXTRACTION      OB History    No data available       Home Medications    Prior to Admission medications   Medication Sig Start Date End Date Taking? Authorizing Provider  amoxicillin (AMOXIL) 500 MG capsule Take 1 capsule (500 mg total) by mouth 2 (two) times daily. 09/21/16   Volanda Napoleon, PA-C  bismuth subsalicylate (PEPTO BISMOL) 262 MG chewable tablet Chew 262 mg  by mouth as needed for indigestion.    [provider]  ibuprofen (ADVIL,MOTRIN) 200 MG tablet Take 200 mg by mouth every 6 (six) hours as needed for headache, moderate pain or cramping.    [provider]  naproxen (NAPROSYN) 500 MG tablet Take 1 tablet (500 mg total) by mouth 2 (two) times daily. Patient not taking: Reported on 09/18/2016 12/06/14   Jeannett Senior, PA-C  predniSONE (DELTASONE) 20 MG tablet Take 2 tablets (40 mg total) by mouth daily. Patient not taking: Reported on 09/18/2016 08/17/16   Nona Dell, PA-C  promethazine (PHENERGAN) 25 MG tablet Take 1 tablet (25 mg total) by mouth every 6 (six) hours as needed for nausea or vomiting. 09/18/16   Domenic Moras, PA-C    Family History Family History  Problem Relation Age of Onset  . Breast cancer Mother   . Hypertension Father   . Diabetes Maternal Grandmother   . Diabetes Paternal Grandmother     Social History Social History  Substance Use Topics  . Smoking status: Former Research scientist (life sciences)  . Smokeless tobacco: Never Used  . Alcohol use Yes     Comment: socially wine or beer     Allergies   Latex   Review of Systems Review of Systems  Constitutional: Negative for fever.  HENT: Positive for sore throat. Negative for trouble swallowing.   Gastrointestinal: Negative for nausea and vomiting.  Physical Exam Updated Vital Signs BP 113/77 (BP Location: Left Arm)   Pulse 87   Temp 98.5 F (36.9 C) (Oral)   Resp 18   Ht 5\' 10"  (1.778 m)   Wt 85.4 kg (188 lb 3 oz)   LMP 08/22/2016   SpO2 100%   BMI 27.00 kg/m   Physical Exam  Constitutional: She appears well-developed and well-nourished.  Sitting comfortably on examination table  HENT:  Head: Normocephalic and atraumatic.  Mouth/Throat: No trismus in the jaw. Posterior oropharyngeal edema and posterior oropharyngeal erythema present.  Posterior oropharynx is erythema bilateral exudates Right tonsil with overlying areas of  blackish discoloration.  No evidence of peritonsillar abscess. No trismus No apparent facial swelling.   Eyes: Conjunctivae and EOM are normal. Right eye exhibits no discharge. Left eye exhibits no discharge. No scleral icterus.  Pulmonary/Chest: Effort normal.  No evidence of respiratory distress. Able to speak in full sentences without difficulty.  Neurological: She is alert.  Skin: Skin is warm and dry.  Psychiatric: She has a normal mood and affect. Her speech is normal and behavior is normal.  Nursing note and vitals reviewed.        ED Treatments / Results  DIAGNOSTIC STUDIES:  Oxygen Saturation is 100% on RA, normal by my interpretation.    COORDINATION OF CARE:  1:00 PM Discussed treatment plan with pt at bedside and pt agreed to plan.  Labs (all labs ordered are listed, but only abnormal results are displayed) Labs Reviewed  RAPID STREP SCREEN (NOT AT [facility]) - Abnormal; Notable for the following:       Result Value   Streptococcus, Group A Screen (Direct) POSITIVE (*)    All other components within normal limits    EKG  EKG Interpretation None       Radiology No results found.  Procedures Procedures (including critical care time)  Medications Ordered in ED Medications  acetaminophen (TYLENOL) tablet 1,000 mg (1,000 mg Oral Given 09/21/16 1321)     Initial Impression / Assessment and Plan / ED Course  I have reviewed the triage vital signs and the nursing notes.  Pertinent labs & imaging results that were available during my care of the patient were reviewed by me and considered in my medical decision making (see chart for details).     31 year old female who presents with 2 days of sore throat. No associated fever difficulty swelling. Patient is afebrile, non-toxic appearing, sitting comfortably on examination table. No medications tried prior to arrival. Physical exam with erythema and x-ray states to bilateral tonsils. Right tonsil with some  overlying areas of blackish discoloration. No evidence of peritonsillar abscess or Ludwig angina on exam. Rapid strep ordered at triage. Patient given analgesics in the department.  Pt rapid strep test positive. Pt is tolerating secretions. We'll plan to treat with antibiotics. Patient instructed to monitor her posterior oropharynx for discoloration. If no improvement in the next 5-7 days she is to follow-up with referred ENT doctor for further evaluation. Specific return precautions discussed. Recommended PCP follow up. Strict return precautions discussed. Patient expresses understanding and agreement to plan.    Final Clinical Impressions(s) / ED Diagnoses   Final diagnoses:  Strep pharyngitis    New Prescriptions New Prescriptions   AMOXICILLIN (AMOXIL) 500 MG CAPSULE    Take 1 capsule (500 mg total) by mouth 2 (two) times daily.  I personally performed the services described in this documentation, which was scribed in my presence. The recorded information has been  reviewed and is accurate.    Volanda Napoleon, PA-C 09/21/16 1334    Malvin Johns, MD 09/21/16 1355

## 2016-09-21 NOTE — Discharge Instructions (Signed)
Take antibiotics as directed. Please take all of your antibiotics until finished.  You can take Tylenol and ibuprofen as needed for the pain. You can alternate Tylenol and ibuprofen every 4 hours.  As we discussed if her symptoms are not improving, follow up with the referred ENT doctor within the next 5-7 days.  Return the emergency Department for any worsening pain, fever, difficulty breathing, difficulty swallowing, difficulty eating or any other worsening or concerning symptoms.

## 2016-09-21 NOTE — ED Triage Notes (Signed)
Pt. Stated, I started having a sore throat last night.

## 2016-09-21 NOTE — ED Notes (Signed)
Pt c/o sore throat x 2 days. Tonsils swollen with exudate, white and dark- colored. When performing swab, noticed right tonsil was firm.

## 2016-10-01 ENCOUNTER — Other Ambulatory Visit (HOSPITAL_COMMUNITY)
Admission: RE | Admit: 2016-10-01 | Discharge: 2016-10-01 | Disposition: A | Discharge status: Home / Self Care | Payer: Medicaid Other | Source: Ambulatory Visit | Attending: Obstetrics & Gynecology | Admitting: Obstetrics & Gynecology

## 2016-10-01 ENCOUNTER — Ambulatory Visit (INDEPENDENT_AMBULATORY_CARE_PROVIDER_SITE_OTHER): Payer: Medicaid Other | Admitting: Obstetrics & Gynecology

## 2016-10-01 ENCOUNTER — Encounter: Payer: Self-pay | Admitting: Obstetrics & Gynecology

## 2016-10-01 VITALS — BP 120/81 | HR 77 | Ht 70.0 in | Wt 191.0 lb

## 2016-10-01 DIAGNOSIS — Z01419 Encounter for gynecological examination (general) (routine) without abnormal findings: Secondary | ICD-10-CM | POA: Insufficient documentation

## 2016-10-01 DIAGNOSIS — Z0000 Encounter for general adult medical examination without abnormal findings: Secondary | ICD-10-CM

## 2016-10-01 DIAGNOSIS — B9689 Other specified bacterial agents as the cause of diseases classified elsewhere: Secondary | ICD-10-CM | POA: Diagnosis not present

## 2016-10-01 DIAGNOSIS — N898 Other specified noninflammatory disorders of vagina: Secondary | ICD-10-CM | POA: Insufficient documentation

## 2016-10-01 DIAGNOSIS — Z113 Encounter for screening for infections with a predominantly sexual mode of transmission: Secondary | ICD-10-CM | POA: Insufficient documentation

## 2016-10-01 DIAGNOSIS — N760 Acute vaginitis: Secondary | ICD-10-CM | POA: Insufficient documentation

## 2016-10-01 MED ORDER — IBUPROFEN 800 MG PO TABS: 800.0000 mg | ORAL_TABLET | Freq: Three times a day (TID) | ORAL | 1 refills | Status: DC | PRN

## 2016-10-01 NOTE — Progress Notes (Signed)
Pt requests pap, vaginal discharge check, and STD testing. Does not desire BC at this time.

## 2016-10-01 NOTE — Progress Notes (Signed)
Subjective:    Caitlin Patrick is a 31 y.o. S AA P1 (son 57 yo)  female who presents for an annual exam. The patient has no complaints today. The patient is not currently sexually active. GYN screening history: last pap: was normal. The patient wears seatbelts: yes. The patient participates in regular exercise: yes. Has the patient ever been transfused or tattooed?: yes. The patient reports that there is not domestic violence in her life.   Menstrual History: OB History    Gravida Para Term Preterm AB Living   2 1 1   1 1    SAB TAB Ectopic Multiple Live Births     1     1      Menarche age: 31 Patient's last menstrual period was 09/05/2016 (exact date).    The following portions of the patient's history were reviewed and updated as appropriate: allergies, current medications, past family history, past medical history, past social history, past surgical history and problem list.  Review of Systems Pertinent items are noted in HPI.   FH- + for breast cancer in her mom, diagnosed at 36 yo, [facility] No gyn/colon cancer Works at [employer] since 12/17 Strictly lesbian since 2006 Describes painful periods, but doesn't want OCPs    Objective:    BP 120/81   Pulse 77   Ht 5\' 10"  (1.778 m)   Wt 191 lb (86.6 kg)   LMP 09/05/2016 (Exact Date)   BMI 27.41 kg/m   General Appearance:    Alert, cooperative, no distress, appears stated age  Head:    Normocephalic, without obvious abnormality, atraumatic  Eyes:    PERRL, conjunctiva/corneas clear, EOM's intact, fundi    benign, both eyes  Ears:    Normal TM's and external ear canals, both ears  Nose:   Nares normal, septum midline, mucosa normal, no drainage    or sinus tenderness  Throat:   Lips, mucosa, and tongue normal; teeth and gums normal  Neck:   Supple, symmetrical, trachea midline, no adenopathy;    thyroid:  no enlargement/tenderness/nodules; no carotid   bruit or JVD  Back:     Symmetric, no curvature, ROM normal,  no CVA tenderness  Lungs:     Clear to auscultation bilaterally, respirations unlabored  Chest Wall:    No tenderness or deformity   Heart:    Regular rate and rhythm, S1 and S2 normal, no murmur, rub   or gallop  Breast Exam:    No tenderness, masses, or nipple abnormality  Abdomen:     Soft, non-tender, bowel sounds active all four quadrants,    no masses, no organomegaly  Genitalia:    Normal female without lesion, discharge or tenderness, NSSA, NT, mobile, no adnexal masses     Extremities:   Extremities normal, atraumatic, no cyanosis or edema  Pulses:   2+ and symmetric all extremities  Skin:   Skin color, texture, turgor normal, no rashes or lesions  Lymph nodes:   Cervical, supraclavicular, and axillary nodes normal  Neurologic:   CNII-XII intact, normal strength, sensation and reflexes    throughout  .    Assessment:    Healthy female exam.    Plan:     Thin prep Pap smear. with cotesting IBU 800mg  prn cramps

## 2016-10-01 NOTE — Addendum Note (Signed)
Addended by: Lewie Loron D on: 10/01/2016 03:27 PM   Modules accepted: Orders

## 2016-10-02 LAB — HIV ANTIBODY (ROUTINE TESTING W REFLEX): HIV Screen 4th Generation wRfx: NONREACTIVE

## 2016-10-02 LAB — RPR: RPR Ser Ql: NONREACTIVE

## 2016-10-02 LAB — HEPATITIS C ANTIBODY: Hep C Virus Ab: 0.1 s/co ratio (ref 0.0–0.9)

## 2016-10-02 LAB — HEPATITIS B SURFACE ANTIGEN: HEP B S AG: NEGATIVE

## 2016-10-04 LAB — CYTOLOGY - PAP
Bacterial vaginitis: POSITIVE — AB
CANDIDA VAGINITIS: NEGATIVE
Chlamydia: NEGATIVE
DIAGNOSIS: NEGATIVE
HPV: NOT DETECTED
Neisseria Gonorrhea: NEGATIVE
Trichomonas: NEGATIVE

## 2016-10-09 ENCOUNTER — Telehealth: Payer: Self-pay

## 2016-10-09 DIAGNOSIS — B9689 Other specified bacterial agents as the cause of diseases classified elsewhere: Secondary | ICD-10-CM

## 2016-10-09 DIAGNOSIS — N760 Acute vaginitis: Principal | ICD-10-CM

## 2016-10-09 MED ORDER — METRONIDAZOLE 500 MG PO TABS: 500.0000 mg | ORAL_TABLET | Freq: Two times a day (BID) | ORAL | 0 refills | Status: DC

## 2016-10-09 NOTE — Telephone Encounter (Signed)
Contacted pt and advised of results and rx to be sent per provider order.

## 2016-11-28 ENCOUNTER — Encounter (HOSPITAL_COMMUNITY): Payer: Self-pay | Admitting: Emergency Medicine

## 2016-11-28 ENCOUNTER — Emergency Department (HOSPITAL_COMMUNITY)
Admission: EM | Admit: 2016-11-28 | Discharge: 2016-11-28 | Disposition: A | Discharge status: Home / Self Care | Payer: Medicaid Other | Attending: Emergency Medicine | Admitting: Emergency Medicine

## 2016-11-28 DIAGNOSIS — R0981 Nasal congestion: Secondary | ICD-10-CM | POA: Insufficient documentation

## 2016-11-28 DIAGNOSIS — J029 Acute pharyngitis, unspecified: Secondary | ICD-10-CM | POA: Diagnosis present

## 2016-11-28 DIAGNOSIS — Z87891 Personal history of nicotine dependence: Secondary | ICD-10-CM | POA: Diagnosis not present

## 2016-11-28 LAB — RAPID STREP SCREEN (MED CTR MEBANE ONLY): Streptococcus, Group A Screen (Direct): NEGATIVE

## 2016-11-28 MED ORDER — PENICILLIN G BENZATHINE 1200000 UNIT/2ML IM SUSP
1.2000 10*6.[IU] | Freq: Once | INTRAMUSCULAR | Status: AC
  Administered 2016-11-28: 1.2 10*6.[IU] via INTRAMUSCULAR
  Filled 2016-11-28: qty 2

## 2016-11-28 MED ORDER — KETOROLAC TROMETHAMINE 15 MG/ML IJ SOLN
15.0000 mg | Freq: Once | INTRAMUSCULAR | Status: AC
  Administered 2016-11-28: 15 mg via INTRAMUSCULAR
  Filled 2016-11-28: qty 1

## 2016-11-28 MED ORDER — LIDOCAINE VISCOUS 2 % MT SOLN: 20.0000 mL | OROMUCOSAL | 0 refills | Status: DC | PRN

## 2016-11-28 MED ORDER — DEXAMETHASONE 1 MG/ML PO CONC
10.0000 mg | Freq: Once | ORAL | Status: AC
  Administered 2016-11-28: 10 mg via ORAL
  Filled 2016-11-28: qty 10

## 2016-11-28 NOTE — Discharge Instructions (Signed)
May take Motrin and Tylenol for pain. Drink plenty of fluids. Use the viscous lidocaine to help numb the throat. If you develop any worsening symptoms return to the ED. Follow-up with primary care doctor as needed.

## 2016-11-28 NOTE — ED Provider Notes (Signed)
[facility] DEPT Provider Note   CSN: [id number] Arrival date & time: 11/28/16  1005     History   Chief Complaint Chief Complaint  Patient presents with  . Sore Throat    HPI Caitlin Patrick is a 31 y.o. female.  HPI  31 year old African-American female with no sign pmh presents to the ED today with complaints of sore throat. Patient states her throat started hurting yesterday. Reports associated nasal congestion and rhinorrhea. States that she had strep throat 3 months ago this feels very similar. She has tried Motrin and sinus relief at home for her symptoms with little relief. Reports sick contacts. Denies any fever, chills, cough, otalgia, sinus pressure, neck stiffness, muffled voice. Past Medical History:  Diagnosis Date  . Sickle cell trait [facility])     Patient Active Problem List   Diagnosis Date Noted  . Family history of diabetes mellitus (DM) 04/23/2013  . BMI 31.0-31.9,adult 04/23/2013    Past Surgical History:  Procedure Laterality Date  . CESAREAN SECTION    . MULTIPLE TOOTH EXTRACTIONS Left   . WISDOM TOOTH EXTRACTION      OB History    Gravida Para Term Preterm AB Living   2 1 1   1 1    SAB TAB Ectopic Multiple Live Births     1     1       Home Medications    Prior to Admission medications   Medication Sig Start Date End Date Taking? Authorizing Provider  amoxicillin (AMOXIL) 500 MG capsule Take 1 capsule (500 mg total) by mouth 2 (two) times daily. 09/21/16   Volanda Napoleon, PA-C  bismuth subsalicylate (PEPTO BISMOL) 262 MG chewable tablet Chew 262 mg by mouth as needed for indigestion.    [provider]  ibuprofen (ADVIL,MOTRIN) 200 MG tablet Take 200 mg by mouth every 6 (six) hours as needed for headache, moderate pain or cramping.    [provider]  ibuprofen (ADVIL,MOTRIN) 800 MG tablet Take 1 tablet (800 mg total) by mouth every 8 (eight) hours as needed. 10/01/16   Emily Filbert, MD  lidocaine (XYLOCAINE) 2 % solution  Use as directed 20 mLs in the mouth or throat as needed for mouth pain. 11/28/16   Doristine Devoid, PA-C  metroNIDAZOLE (FLAGYL) 500 MG tablet Take 1 tablet (500 mg total) by mouth 2 (two) times daily. 10/09/16   Emily Filbert, MD  naproxen (NAPROSYN) 500 MG tablet Take 1 tablet (500 mg total) by mouth 2 (two) times daily. Patient not taking: Reported on 09/18/2016 12/06/14   Jeannett Senior, PA-C  predniSONE (DELTASONE) 20 MG tablet Take 2 tablets (40 mg total) by mouth daily. Patient not taking: Reported on 09/18/2016 08/17/16   Nona Dell, PA-C  promethazine (PHENERGAN) 25 MG tablet Take 1 tablet (25 mg total) by mouth every 6 (six) hours as needed for nausea or vomiting. Patient not taking: Reported on 10/01/2016 09/18/16   Domenic Moras, PA-C    Family History Family History  Problem Relation Age of Onset  . Breast cancer Mother   . Hypertension Father   . Diabetes Maternal Grandmother   . Diabetes Paternal Grandmother     Social History Social History  Substance Use Topics  . Smoking status: Former Research scientist (life sciences)  . Smokeless tobacco: Never Used  . Alcohol use Yes     Comment: socially wine or beer     Allergies   Patient has no active allergies.   Review of  Systems Review of Systems Constitutional: Negative for chills and fever.  HENT: Positive for congestion, rhinorrhea and sore throat. Negative for ear pain, sinus pain and sinus pressure.   Respiratory: Negative for cough.   Gastrointestinal: Negative for nausea and vomiting.  Musculoskeletal: Negative for myalgias.  Skin: Negative for rash.    Physical Exam Updated Vital Signs BP 127/79 (BP Location: Right Arm)   Pulse 70   Temp 98.2 F (36.8 C) (Oral)   Resp 16   Ht 5\' 10"  (1.778 m)   Wt 88.5 kg (195 lb)   LMP 11/23/2016 (Approximate)   SpO2 100%   BMI 27.98 kg/m   Physical Exam Constitutional: He appears well-developed and well-nourished. No distress.  HENT:  Head: Normocephalic and  atraumatic.  Right Ear: Hearing, tympanic membrane, external ear and ear canal normal.  Left Ear: Hearing, tympanic membrane, external ear and ear canal normal.  Nose: Mucosal edema and rhinorrhea present. Right sinus exhibits no maxillary sinus tenderness and no frontal sinus tenderness. Left sinus exhibits no maxillary sinus tenderness and no frontal sinus tenderness.  Mouth/Throat: Uvula is midline and mucous membranes are normal. No trismus in the jaw. Uvula swelling present. Oropharyngeal exudate, posterior oropharyngeal edema and posterior oropharyngeal erythema present. No tonsillar abscesses. Tonsils are 1+ on the right. Tonsils are 1+ on the left. Tonsillar exudate.  No abnormal phonation. Uvula midline. Managing secretions and tolerating airway.  Eyes: Right eye exhibits no discharge. Left eye exhibits no discharge. No scleral icterus.  Neck: Normal range of motion. Neck supple.  No c spine midline tenderness. No paraspinal tenderness. No deformities or step offs noted. Full ROM. Supple. No nuchal rigidity.    Pulmonary/Chest: No respiratory distress.  Musculoskeletal: Normal range of motion.  Lymphadenopathy:    He has no cervical adenopathy.  Neurological: He is alert.  Skin: Skin is warm and dry. No pallor.  Psychiatric: His behavior is normal. Judgment and thought content normal.  Nursing note and vitals reviewed.  ED Treatments / Results  Labs (all labs ordered are listed, but only abnormal results are displayed) Labs Reviewed  RAPID STREP SCREEN (NOT AT [facility])  CULTURE, GROUP A STREP [facility])    EKG  EKG Interpretation None       Radiology No results found.  Procedures Procedures (including critical care time)  Medications Ordered in ED Medications  dexamethasone (DECADRON) 1 MG/ML solution 10 mg (not administered)  ketorolac (TORADOL) 15 MG/ML injection 15 mg (not administered)  penicillin g benzathine (BICILLIN LA) 1200000 UNIT/2ML injection 1.2 Million  Units (not administered)     Initial Impression / Assessment and Plan / ED Course  I have reviewed the triage vital signs and the nursing notes.  Pertinent labs & imaging results that were available during my care of the patient were reviewed by me and considered in my medical decision making (see chart for details).     Pt febrile with tonsillar exudate,  dysphagia; diagnosis of strep. Treated in the Ed with steroids, NSAIDs, Pain medication and PCN IM.  Pt appears mildly dehydrated, discussed importance of water rehydration. Presentation non concerning for PTA or infxn spread to soft tissue. No trismus or uvula deviation. Specific return precautions discussed. Pt able to drink water in ED without difficulty with intact air way. Recommended PCP follow up.   Final Clinical Impressions(s) / ED Diagnoses   Final diagnoses:  Sore throat    New Prescriptions New Prescriptions   LIDOCAINE (XYLOCAINE) 2 % SOLUTION    Use as  directed 20 mLs in the mouth or throat as needed for mouth pain.     Doristine Devoid, PA-C 11/28/16 [place], DO 11/28/16 1140

## 2016-11-28 NOTE — ED Triage Notes (Signed)
Pt states that she has had a sore throat x 2 days. Noticed a white patch on her uvula today. Alert and oriented.

## 2016-11-30 LAB — CULTURE, GROUP A STREP (THRC)

## 2016-12-01 ENCOUNTER — Other Ambulatory Visit: Payer: Self-pay | Admitting: Obstetrics & Gynecology

## 2017-01-06 ENCOUNTER — Encounter: Payer: Self-pay | Admitting: Obstetrics

## 2017-01-06 ENCOUNTER — Ambulatory Visit (INDEPENDENT_AMBULATORY_CARE_PROVIDER_SITE_OTHER): Payer: Medicaid Other | Admitting: Obstetrics

## 2017-01-06 ENCOUNTER — Other Ambulatory Visit (HOSPITAL_COMMUNITY)
Admission: RE | Admit: 2017-01-06 | Discharge: 2017-01-06 | Disposition: A | Discharge status: Home / Self Care | Payer: Medicaid Other | Source: Ambulatory Visit | Attending: Obstetrics | Admitting: Obstetrics

## 2017-01-06 VITALS — BP 127/84 | HR 87 | Ht 70.0 in | Wt 202.8 lb

## 2017-01-06 DIAGNOSIS — N898 Other specified noninflammatory disorders of vagina: Secondary | ICD-10-CM

## 2017-01-06 DIAGNOSIS — Z113 Encounter for screening for infections with a predominantly sexual mode of transmission: Secondary | ICD-10-CM

## 2017-01-06 MED ORDER — FLUCONAZOLE 150 MG PO TABS: 150.0000 mg | ORAL_TABLET | Freq: Once | ORAL | 0 refills | Status: AC

## 2017-01-06 NOTE — Progress Notes (Signed)
Patient ID: Caitlin Patrick, female   DOB: December 19, 1985, 31 y.o.   MRN: [id number]  Chief Complaint  Patient presents with  . GYN    HPI Caitlin Patrick is a 31 y.o. female.  Has new partner.  Vaginal discharge and irritation ~ 1 week after intercourse.  Denies vaginal odor.  Denies pelvic pain.   HPI  Past Medical History:  Diagnosis Date  . Sickle cell trait [facility])     Past Surgical History:  Procedure Laterality Date  . CESAREAN SECTION    . MULTIPLE TOOTH EXTRACTIONS Left   . WISDOM TOOTH EXTRACTION      Family History  Problem Relation Age of Onset  . Breast cancer Mother   . Hypertension Father   . Diabetes Maternal Grandmother   . Diabetes Paternal Grandmother     Social History Social History  Substance Use Topics  . Smoking status: Former Research scientist (life sciences)  . Smokeless tobacco: Never Used  . Alcohol use Yes     Comment: socially wine or beer    No Known Allergies  Current Outpatient Prescriptions  Medication Sig Dispense Refill  . amoxicillin (AMOXIL) 500 MG capsule Take 1 capsule (500 mg total) by mouth 2 (two) times daily. (Patient not taking: Reported on 01/06/2017) 14 capsule 0  . bismuth subsalicylate (PEPTO BISMOL) 262 MG chewable tablet Chew 262 mg by mouth as needed for indigestion.    . fluconazole (DIFLUCAN) 150 MG tablet Take 1 tablet (150 mg total) by mouth once. 1 tablet 0  . ibuprofen (ADVIL,MOTRIN) 200 MG tablet Take 200 mg by mouth every 6 (six) hours as needed for headache, moderate pain or cramping.    Marland Kitchen ibuprofen (ADVIL,MOTRIN) 800 MG tablet TAKE 1 TABLET BY MOUTH EVERY 8 HOURS AS NEEDED (Patient not taking: Reported on 01/06/2017) 60 tablet 1  . lidocaine (XYLOCAINE) 2 % solution Use as directed 20 mLs in the mouth or throat as needed for mouth pain. (Patient not taking: Reported on 01/06/2017) 100 mL 0  . metroNIDAZOLE (FLAGYL) 500 MG tablet Take 1 tablet (500 mg total) by mouth 2 (two) times daily. (Patient not taking: Reported on 01/06/2017) 14  tablet 0  . naproxen (NAPROSYN) 500 MG tablet Take 1 tablet (500 mg total) by mouth 2 (two) times daily. (Patient not taking: Reported on 09/18/2016) 30 tablet 0  . predniSONE (DELTASONE) 20 MG tablet Take 2 tablets (40 mg total) by mouth daily. (Patient not taking: Reported on 09/18/2016) 10 tablet 0  . promethazine (PHENERGAN) 25 MG tablet Take 1 tablet (25 mg total) by mouth every 6 (six) hours as needed for nausea or vomiting. (Patient not taking: Reported on 10/01/2016) 20 tablet 0   No current facility-administered medications for this visit.     Review of Systems Review of Systems Constitutional: negative for fatigue and weight loss Respiratory: negative for cough and wheezing Cardiovascular: negative for chest pain, fatigue and palpitations Gastrointestinal: negative for abdominal pain and change in bowel habits Genitourinary:positive for vaginal discharge and irritation Integument/breast: negative for nipple discharge Musculoskeletal:negative for myalgias Neurological: negative for gait problems and tremors Behavioral/Psych: negative for abusive relationship, depression Endocrine: negative for temperature intolerance      Blood pressure 127/84, pulse 87, height 5\' 10"  (1.778 m), weight 202 lb 12.8 oz (92 kg), last menstrual period 12/20/2016.  Physical Exam Physical Exam           General:  Alert and no distress Abdomen:  normal findings: no organomegaly, soft, non-tender and no hernia  Pelvis:  External genitalia: normal general appearance Urinary system: urethral meatus normal and bladder without fullness, nontender Vaginal: normal without tenderness, induration or masses.  White, clumpy yeast-like discharge that clings to walls Cervix: normal appearance Adnexa: normal bimanual exam Uterus: anteverted and non-tender, normal size    50% of 15 min visit spent on counseling and coordination of care.    Data Reviewed Wet prep  Assessment and Plan:    1. Vaginal  discharge Rx: - Cervicovaginal ancillary only - fluconazole (DIFLUCAN) 150 MG tablet; Take 1 tablet (150 mg total) by mouth once.  Dispense: 1 tablet; Refill: 0  2. Screen for STD (sexually transmitted disease)     Plan    Follow up prn No orders of the defined types were placed in this encounter.  Meds ordered this encounter  Medications  . fluconazole (DIFLUCAN) 150 MG tablet    Sig: Take 1 tablet (150 mg total) by mouth once.    Dispense:  1 tablet    Refill:  0

## 2017-01-06 NOTE — Progress Notes (Signed)
Patient is in the office for std testing, reports she previously had vaginal itching and discomfort, but took OTC AZO yeast pills and symptoms have been better. Pt reports that her discharge was looking "different". Pt reports that she has a new partner.

## 2017-01-07 ENCOUNTER — Other Ambulatory Visit: Payer: Self-pay | Admitting: Obstetrics

## 2017-01-07 DIAGNOSIS — B3731 Acute candidiasis of vulva and vagina: Secondary | ICD-10-CM

## 2017-01-07 DIAGNOSIS — B373 Candidiasis of vulva and vagina: Secondary | ICD-10-CM

## 2017-01-07 DIAGNOSIS — N760 Acute vaginitis: Secondary | ICD-10-CM

## 2017-01-07 DIAGNOSIS — B9689 Other specified bacterial agents as the cause of diseases classified elsewhere: Secondary | ICD-10-CM

## 2017-01-07 LAB — CERVICOVAGINAL ANCILLARY ONLY
BACTERIAL VAGINITIS: POSITIVE — AB
CHLAMYDIA, DNA PROBE: NEGATIVE
Candida vaginitis: POSITIVE — AB
NEISSERIA GONORRHEA: NEGATIVE
Trichomonas: NEGATIVE

## 2017-01-07 MED ORDER — SECNIDAZOLE 2 G PO PACK: 1.0000 | PACK | Freq: Once | ORAL | 2 refills | Status: AC

## 2017-01-07 MED ORDER — FLUCONAZOLE 150 MG PO TABS: 150.0000 mg | ORAL_TABLET | Freq: Once | ORAL | 0 refills | Status: AC

## 2017-08-15 ENCOUNTER — Emergency Department (HOSPITAL_COMMUNITY)
Admission: EM | Admit: 2017-08-15 | Discharge: 2017-08-15 | Disposition: A | Discharge status: Home / Self Care | Payer: Medicaid Other | Attending: Emergency Medicine | Admitting: Emergency Medicine

## 2017-08-15 ENCOUNTER — Encounter (HOSPITAL_COMMUNITY): Payer: Self-pay | Admitting: *Deleted

## 2017-08-15 DIAGNOSIS — J020 Streptococcal pharyngitis: Secondary | ICD-10-CM | POA: Diagnosis not present

## 2017-08-15 DIAGNOSIS — Z87891 Personal history of nicotine dependence: Secondary | ICD-10-CM | POA: Insufficient documentation

## 2017-08-15 DIAGNOSIS — J029 Acute pharyngitis, unspecified: Secondary | ICD-10-CM | POA: Diagnosis present

## 2017-08-15 LAB — GROUP A STREP BY PCR: GROUP A STREP BY PCR: DETECTED — AB

## 2017-08-15 MED ORDER — PENICILLIN G BENZATHINE 1200000 UNIT/2ML IM SUSP
1.2000 10*6.[IU] | Freq: Once | INTRAMUSCULAR | Status: AC
  Administered 2017-08-15: 1.2 10*6.[IU] via INTRAMUSCULAR
  Filled 2017-08-15: qty 2

## 2017-08-15 MED ORDER — DEXAMETHASONE 4 MG PO TABS
10.0000 mg | ORAL_TABLET | Freq: Once | ORAL | Status: AC
  Administered 2017-08-15: 10 mg via ORAL
  Filled 2017-08-15: qty 2

## 2017-08-15 NOTE — ED Provider Notes (Signed)
[place] DEPT Provider Note   CSN: [id number] Arrival date & time: 08/15/17  1324     History   Chief Complaint Chief Complaint  Patient presents with  . Sore Throat    HPI Caitlin Patrick is a 32 y.o. female who presents to the ED with a sore throat. Patient reports that she work yesterday and her throat was sore. She has had strep in the past.  The history is provided by the patient. No language interpreter was used.  Sore Throat  This is a new problem. The current episode started yesterday. The problem occurs constantly. The problem has been gradually worsening. Pertinent negatives include no abdominal pain, no headaches and no shortness of breath. The symptoms are aggravated by swallowing and eating. Nothing relieves the symptoms. She has tried acetaminophen for the symptoms.    Past Medical History:  Diagnosis Date  . Sickle cell trait [facility])     Patient Active Problem List   Diagnosis Date Noted  . Family history of diabetes mellitus (DM) 04/23/2013  . BMI 31.0-31.9,adult 04/23/2013    Past Surgical History:  Procedure Laterality Date  . CESAREAN SECTION    . MULTIPLE TOOTH EXTRACTIONS Left   . WISDOM TOOTH EXTRACTION       OB History    Gravida  2   Para  1   Term  1   Preterm      AB  1   Living  1     SAB      TAB  1   Ectopic      Multiple      Live Births  1            Home Medications    Prior to Admission medications   Medication Sig Start Date End Date Taking? Authorizing Provider  bismuth subsalicylate (PEPTO BISMOL) 262 MG chewable tablet Chew 262 mg by mouth as needed for indigestion.    [provider]  ibuprofen (ADVIL,MOTRIN) 200 MG tablet Take 200 mg by mouth every 6 (six) hours as needed for headache, moderate pain or cramping.    [provider]    Family History Family History  Problem Relation Age of Onset  . Breast cancer Mother   . Hypertension Father   .  Diabetes Maternal Grandmother   . Diabetes Paternal Grandmother     Social History Social History   Tobacco Use  . Smoking status: Former Research scientist (life sciences)  . Smokeless tobacco: Never Used  Substance Use Topics  . Alcohol use: Yes    Comment: socially wine or beer  . Drug use: Yes    Types: Marijuana    Comment: occasionally     Allergies   Patient has no known allergies.   Review of Systems Review of Systems  Constitutional: Negative for chills. Fever: ?  HENT: Positive for sore throat. Negative for drooling, ear pain and trouble swallowing.   Eyes: Negative for redness.  Respiratory: Negative for cough and shortness of breath.   Gastrointestinal: Negative for abdominal pain, nausea and vomiting.  Musculoskeletal: Negative for neck stiffness.  Skin: Negative for rash.  Neurological: Negative for headaches.  Psychiatric/Behavioral: Negative for confusion.     Physical Exam Updated Vital Signs BP 119/80 (BP Location: Right Arm)   Pulse 70   Temp 98.5 F (36.9 C) (Oral)   Resp 20   Ht 5\' 10"  (1.778 m)   Wt 86.2 kg (190 lb)   LMP 07/21/2017  SpO2 100%   BMI 27.26 kg/m   Physical Exam  Constitutional: She is oriented to person, place, and time. She appears well-developed and well-nourished. No distress.  HENT:  Head: Normocephalic.  Right Ear: Tympanic membrane normal.  Left Ear: Tympanic membrane normal.  Mouth/Throat: Oropharyngeal exudate and posterior oropharyngeal erythema present. No posterior oropharyngeal edema or tonsillar abscesses. Tonsils are 2+ on the right. Tonsils are 2+ on the left.  Eyes: EOM are normal.  Neck: Normal range of motion. Neck supple.  Cardiovascular: Normal rate and regular rhythm.  Pulmonary/Chest: Effort normal and breath sounds normal.  Musculoskeletal: Normal range of motion.  Lymphadenopathy:    She has cervical adenopathy.  Neurological: She is alert and oriented to person, place, and time. No cranial nerve deficit.  Skin: Skin  is warm and dry.  Psychiatric: She has a normal mood and affect. Her behavior is normal.  Nursing note and vitals reviewed.    ED Treatments / Results  Labs (all labs ordered are listed, but only abnormal results are displayed) Labs Reviewed  GROUP A STREP BY PCR - Abnormal; Notable for the following components:      Result Value   Group A Strep by PCR DETECTED (*)    All other components within normal limits    EKG None  Radiology No results found.  Procedures Procedures (including critical care time)  Medications Ordered in ED Medications  penicillin g benzathine (BICILLIN LA) 1200000 UNIT/2ML injection 1.2 Million Units (has no administration in time range)  dexamethasone (DECADRON) tablet 10 mg (has no administration in time range)     Initial Impression / Assessment and Plan / ED Course  I have reviewed the triage vital signs and the nursing notes. Pt with tonsillar exudate, cervical lymphadenopathy, & dysphagia; diagnosis of bacterial pharyngitis. Treated in the ED with steroids and PCN IM.  Pt appears mildly dehydrated, discussed importance of water rehydration. Presentation non concerning for PTA or RPA. No trismus or uvula deviation. Specific return precautions discussed. Pt able to drink water in ED without difficulty with intact air way. Recommended PCP follow up.   Final Clinical Impressions(s) / ED Diagnoses   Final diagnoses:  Strep pharyngitis    ED Discharge Orders    None       Debroah Baller [place], [place] 08/15/17 1513    Julianne Rice, MD 08/23/17 2130

## 2017-08-15 NOTE — ED Triage Notes (Signed)
Pt complains of sore throat since yesterday. Pt states she has had some nasal congestion over the past few days.

## 2017-08-15 NOTE — ED Notes (Signed)
Pt is c/o sore throat that started yesterday, pt took ibuprofen for pain and reported some relief.

## 2017-08-15 NOTE — ED Notes (Signed)
Pt has some purple like patches on adenoids, and white secretions are noted.

## 2017-08-15 NOTE — Discharge Instructions (Signed)
We have treated your strep throat with an injection of penicillin. Follow up with your doctor. Return here as needed. Take tylenol and ibuprofen as needed for fever and pain. Return here as needed.

## 2017-08-16 ENCOUNTER — Telehealth: Payer: Self-pay

## 2017-08-16 NOTE — Telephone Encounter (Signed)
Post ED Visit - Positive Culture Follow-up  Culture report reviewed by antimicrobial stewardship pharmacist:  []  Elenor Quinones, Pharm.D. []  Heide Guile, Pharm.D., BCPS AQ-ID []  Parks Neptune, Pharm.D., BCPS []  Alycia Rossetti, Pharm.D., BCPS []  [employer], Pharm.D., BCPS, AAHIVP []  Legrand Como, Pharm.D., BCPS, AAHIVP []  Salome Arnt, PharmD, BCPS []  Jalene Mullet, PharmD []  Vincenza Hews, PharmD, BCPS [facility] Pharm D Positive strep culture Treated with Bicillin LA, organism sensitive to the same and no further patient follow-up is required at this time.  Genia Del 08/16/2017, 9:04 AM

## 2018-01-19 ENCOUNTER — Ambulatory Visit: Payer: Medicaid Other | Admitting: Obstetrics and Gynecology

## 2018-03-06 ENCOUNTER — Ambulatory Visit: Payer: Medicaid Other | Admitting: Obstetrics and Gynecology

## 2018-06-29 ENCOUNTER — Ambulatory Visit: Payer: Medicaid Other

## 2018-07-02 ENCOUNTER — Ambulatory Visit: Payer: Medicaid Other

## 2018-12-14 ENCOUNTER — Emergency Department (HOSPITAL_COMMUNITY)
Admission: EM | Admit: 2018-12-14 | Discharge: 2018-12-15 | Disposition: A | Discharge status: Home / Self Care | Payer: Medicaid Other | Attending: Emergency Medicine | Admitting: Emergency Medicine

## 2018-12-14 ENCOUNTER — Encounter (HOSPITAL_COMMUNITY): Payer: Self-pay | Admitting: Emergency Medicine

## 2018-12-14 ENCOUNTER — Other Ambulatory Visit: Payer: Self-pay

## 2018-12-14 ENCOUNTER — Emergency Department (HOSPITAL_COMMUNITY): Payer: Medicaid Other

## 2018-12-14 DIAGNOSIS — M79662 Pain in left lower leg: Secondary | ICD-10-CM | POA: Insufficient documentation

## 2018-12-14 DIAGNOSIS — M7989 Other specified soft tissue disorders: Secondary | ICD-10-CM

## 2018-12-14 DIAGNOSIS — R2242 Localized swelling, mass and lump, left lower limb: Secondary | ICD-10-CM | POA: Insufficient documentation

## 2018-12-14 DIAGNOSIS — Z87891 Personal history of nicotine dependence: Secondary | ICD-10-CM | POA: Diagnosis not present

## 2018-12-14 NOTE — ED Triage Notes (Signed)
Patient here from home with complaints of left calf pain radiating down into ankle and up to knee. Denies injury.

## 2018-12-15 ENCOUNTER — Encounter (HOSPITAL_COMMUNITY): Payer: Self-pay | Admitting: Emergency Medicine

## 2018-12-15 ENCOUNTER — Other Ambulatory Visit: Payer: Self-pay

## 2018-12-15 ENCOUNTER — Ambulatory Visit (HOSPITAL_BASED_OUTPATIENT_CLINIC_OR_DEPARTMENT_OTHER)
Admission: RE | Admit: 2018-12-15 | Discharge: 2018-12-15 | Disposition: A | Discharge status: Home / Self Care | Payer: Medicaid Other | Source: Ambulatory Visit | Attending: Emergency Medicine | Admitting: Emergency Medicine

## 2018-12-15 DIAGNOSIS — M79609 Pain in unspecified limb: Secondary | ICD-10-CM | POA: Diagnosis not present

## 2018-12-15 DIAGNOSIS — M7989 Other specified soft tissue disorders: Secondary | ICD-10-CM

## 2018-12-15 MED ORDER — RIVAROXABAN 15 MG PO TABS
15.0000 mg | ORAL_TABLET | Freq: Once | ORAL | Status: AC
  Administered 2018-12-15: 15 mg via ORAL
  Filled 2018-12-15: qty 1

## 2018-12-15 NOTE — Progress Notes (Signed)
LLE venous duplex       has been completed. Preliminary results can be found under CV proc through chart review. Shalynn Jorstad, BS, RDMS, RVT    

## 2018-12-15 NOTE — Discharge Instructions (Addendum)
Get help right away if: °You have: °New or increased pain, swelling, or redness in an arm or leg. °Numbness or tingling in an arm or leg. °Shortness of breath. °Chest pain. °A rapid or irregular heartbeat. °A severe headache or confusion. °A cut that will not stop bleeding. °There is blood in your vomit, stool, or urine. °You have a serious fall or accident, or you hit your head. °You feel light-headed or dizzy. °You cough up blood. °

## 2018-12-15 NOTE — ED Provider Notes (Signed)
[place] DEPT Provider Note   CSN: [id number] Arrival date & time: 12/14/18  2052     History   Chief Complaint Chief Complaint  Patient presents with  . Ankle Pain  . Knee Pain    HPI Caitlin Patrick is a 33 y.o. female.  Presents emergency department chief complaint of left lower extremity swelling and pain.  Patient states that she noticed that her ankle was swollen about 2 days ago.  Over the past 2 days she has had increasing pain and swelling in the left calf, pain in the left knee and a sensation of heaviness in the leg.  She states that the only injury she had was stubbing her pinky toe very badly about 6 weeks ago.  She has had no recent confinement.  She does not smoke cigarettes.  She has no history of cancer.  She uses no exogenous estrogens.  She has no personal or family history of blood clots.  She denies chest pain or shortness of breath.     HPI  Past Medical History:  Diagnosis Date  . Sickle cell trait [facility])     Patient Active Problem List   Diagnosis Date Noted  . Family history of diabetes mellitus (DM) 04/23/2013  . BMI 31.0-31.9,adult 04/23/2013    Past Surgical History:  Procedure Laterality Date  . CESAREAN SECTION    . MULTIPLE TOOTH EXTRACTIONS Left   . WISDOM TOOTH EXTRACTION       OB History    Gravida  2   Para  1   Term  1   Preterm      AB  1   Living  1     SAB      TAB  1   Ectopic      Multiple      Live Births  1            Home Medications    Prior to Admission medications   Medication Sig Start Date End Date Taking? Authorizing Provider  bismuth subsalicylate (PEPTO BISMOL) 262 MG chewable tablet Chew 262 mg by mouth as needed for indigestion.    [provider]  ibuprofen (ADVIL,MOTRIN) 200 MG tablet Take 200 mg by mouth every 6 (six) hours as needed for headache, moderate pain or cramping.    [provider]    Family History Family History   Problem Relation Age of Onset  . Breast cancer Mother   . Hypertension Father   . Diabetes Maternal Grandmother   . Diabetes Paternal Grandmother     Social History Social History   Tobacco Use  . Smoking status: Former Research scientist (life sciences)  . Smokeless tobacco: Never Used  Substance Use Topics  . Alcohol use: Yes    Comment: socially wine or beer  . Drug use: Yes    Types: Marijuana    Comment: occasionally     Allergies   Patient has no known allergies.   Review of Systems Review of Systems Ten systems reviewed and are negative for acute change, except as noted in the HPI.      Physical Exam Updated Vital Signs BP 131/78 (BP Location: Left Arm)   Pulse 83   Temp 99.1 F (37.3 C) (Oral)   Resp 15   Ht 5\' 9"  (1.753 m)   Wt 94.8 kg   LMP 11/15/2018   SpO2 100%   BMI 30.86 kg/m   Physical Exam Vitals signs and nursing note reviewed.  Constitutional:      General: She is not in acute distress.    Appearance: She is well-developed. She is not diaphoretic.  HENT:     Head: Normocephalic and atraumatic.  Eyes:     General: No scleral icterus.    Conjunctiva/sclera: Conjunctivae normal.  Neck:     Musculoskeletal: Normal range of motion.  Cardiovascular:     Rate and Rhythm: Normal rate and regular rhythm.     Heart sounds: Normal heart sounds. No murmur. No friction rub. No gallop.   Pulmonary:     Effort: Pulmonary effort is normal. No respiratory distress.     Breath sounds: Normal breath sounds.  Abdominal:     General: Bowel sounds are normal. There is no distension.     Palpations: Abdomen is soft. There is no mass.     Tenderness: There is no abdominal tenderness. There is no guarding.  Musculoskeletal:     Left lower leg: Edema present.     Comments: Left ankle, calf swelling left leg is larger than the right leg.  Strong DP and PT pulse bilaterally.  Calf compartments are soft.  Ankle and knee examination otherwise normal  Skin:    General: Skin is warm  and dry.  Neurological:     Mental Status: She is alert and oriented to person, place, and time.  Psychiatric:        Behavior: Behavior normal.      ED Treatments / Results  Labs (all labs ordered are listed, but only abnormal results are displayed) Labs Reviewed - No data to display  EKG None  Radiology Dg Ankle Complete Left  Result Date: 12/14/2018 CLINICAL DATA:  Left ankle pain, injury EXAM: LEFT ANKLE COMPLETE - 3+ VIEW COMPARISON:  None. FINDINGS: Lateral soft tissue swelling. No acute bony abnormality. Specifically, no fracture, subluxation, or dislocation. Calcaneal spur. IMPRESSION: No acute bony abnormality. Electronically Signed   By: Rolm Baptise M.D.   On: 12/14/2018 21:54   Dg Knee Complete 4 Views Left  Result Date: 12/14/2018 CLINICAL DATA:  Anterior knee pain. EXAM: LEFT KNEE - COMPLETE 4+ VIEW COMPARISON:  None. FINDINGS: No evidence of fracture, dislocation, or joint effusion. No evidence of arthropathy or other focal bone abnormality. Soft tissues are unremarkable. IMPRESSION: Negative. Electronically Signed   By: Rolm Baptise M.D.   On: 12/14/2018 21:54    Procedures Procedures (including critical care time)  Medications Ordered in ED Medications  Rivaroxaban (XARELTO) tablet 15 mg (has no administration in time range)     Initial Impression / Assessment and Plan / ED Course  I have reviewed the triage vital signs and the nursing notes.  Pertinent labs & imaging results that were available during my care of the patient were reviewed by me and considered in my medical decision making (see chart for details).        Patient with left lower extremity swelling.  Given a dose of Xarelto here tonight.  Will return for DVT study tomorrow morning.  Refer to Ortho for further evaluation if study is negative.  She has no chest pain or shortness of breath, vital signs are within normal limits, no tachycardia or hypoxia.  She appears safe for discharge at this  time  Final Clinical Impressions(s) / ED Diagnoses   Final diagnoses:  Left leg swelling    ED Discharge Orders         Ordered    [facility]     12/15/18 0028  Margarita Mail, PA-C [id number]  Delora Fuel, MD [id number] [phone]

## 2018-12-15 NOTE — ED Notes (Signed)
Pt. Documented in error see above note in chart. 

## 2020-02-07 ENCOUNTER — Other Ambulatory Visit (HOSPITAL_COMMUNITY)
Admission: RE | Admit: 2020-02-07 | Discharge: 2020-02-07 | Disposition: A | Discharge status: Home / Self Care | Payer: Medicaid Other | Source: Ambulatory Visit | Attending: Obstetrics and Gynecology | Admitting: Obstetrics and Gynecology

## 2020-02-07 ENCOUNTER — Ambulatory Visit (INDEPENDENT_AMBULATORY_CARE_PROVIDER_SITE_OTHER): Payer: Medicaid Other | Admitting: Obstetrics and Gynecology

## 2020-02-07 ENCOUNTER — Encounter: Payer: Self-pay | Admitting: Obstetrics and Gynecology

## 2020-02-07 VITALS — BP 105/72 | HR 85 | Ht 70.0 in | Wt 206.0 lb

## 2020-02-07 DIAGNOSIS — Z01419 Encounter for gynecological examination (general) (routine) without abnormal findings: Secondary | ICD-10-CM | POA: Insufficient documentation

## 2020-02-07 DIAGNOSIS — R102 Pelvic and perineal pain: Secondary | ICD-10-CM | POA: Insufficient documentation

## 2020-02-07 NOTE — Progress Notes (Signed)
GYN presents for painful menses 10/10 for 10 days, NV, clots.  LMP 01/24/2020  Last PAP 10/01/2016

## 2020-02-07 NOTE — Progress Notes (Signed)
Subjective:     Caitlin Patrick is a 34 y.o. female P1 with BMI 29 and LMP 01/24/2020 who is here for a comprehensive physical exam. The patient reports dysmenorrhea which worsened over the past year. Patient is sexually active in a same sex relationship. She reports a monthly 10 day period associated with heavy flow and dysmenorrhea. She often reports pelvic pain a week before onset of menses. Ibuprofen works sporadically. Patient denies any abnormal discharge. She is not interested in contraception  Past Medical History:  Diagnosis Date  . Sickle cell trait [facility])    Past Surgical History:  Procedure Laterality Date  . CESAREAN SECTION    . MULTIPLE TOOTH EXTRACTIONS Left   . WISDOM TOOTH EXTRACTION     Family History  Problem Relation Age of Onset  . Breast cancer Mother   . Hypertension Father   . Diabetes Maternal Grandmother   . Diabetes Paternal Grandmother     Social History   Socioeconomic History  . Marital status: Single    Spouse name: Not on file  . Number of children: Not on file  . Years of education: Not on file  . Highest education level: Not on file  Occupational History  . Not on file  Tobacco Use  . Smoking status: Former Research scientist (life sciences)  . Smokeless tobacco: Never Used  Vaping Use  . Vaping Use: Never used  Substance and Sexual Activity  . Alcohol use: Yes    Comment: socially wine or beer  . Drug use: Yes    Types: Marijuana    Comment: occasionally  . Sexual activity: Yes    Partners: Female    Birth control/protection: None  Other Topics Concern  . Not on file  Social History Narrative  . Not on file   Social Determinants of Health   Financial Resource Strain:   . Difficulty of Paying Living Expenses: Not on file  Food Insecurity:   . Worried About Charity fundraiser in the Last Year: Not on file  . Ran Out of Food in the Last Year: Not on file  Transportation Needs:   . Lack of Transportation (Medical): Not on file  . Lack of  Transportation (Non-Medical): Not on file  Physical Activity:   . Days of Exercise per Week: Not on file  . Minutes of Exercise per Session: Not on file  Stress:   . Feeling of Stress : Not on file  Social Connections:   . Frequency of Communication with Friends and Family: Not on file  . Frequency of Social Gatherings with Friends and Family: Not on file  . Attends Religious Services: Not on file  . Active Member of Clubs or Organizations: Not on file  . Attends Archivist Meetings: Not on file  . Marital Status: Not on file  Intimate Partner Violence:   . Fear of Current or Ex-Partner: Not on file  . Emotionally Abused: Not on file  . Physically Abused: Not on file  . Sexually Abused: Not on file   Health Maintenance  Topic Date Due  . COVID-19 Vaccine (1) Never done  . TETANUS/TDAP  Never done  . PAP SMEAR-Modifier  10/02/2019  . INFLUENZA VACCINE  Never done  . Hepatitis C Screening  Completed  . HIV Screening  Completed       Review of Systems Pertinent items noted in HPI and remainder of comprehensive ROS otherwise negative.   Objective:  Blood pressure 105/72, pulse 85, height 5\' 10"  (  1.778 m), weight 206 lb (93.4 kg), last menstrual period 01/24/2020.     GENERAL: Well-developed, well-nourished female in no acute distress.  HEENT: Normocephalic, atraumatic. Sclerae anicteric.  NECK: Supple. Normal thyroid.  LUNGS: Clear to auscultation bilaterally.  HEART: Regular rate and rhythm. BREASTS: Symmetric in size. No palpable masses or lymphadenopathy, skin changes, or nipple drainage. ABDOMEN: Soft, nontender, nondistended. No organomegaly. PELVIC: Normal external female genitalia. Vagina is pink and rugated.  Normal discharge. Normal appearing cervix. Uterus is normal in size. No adnexal mass or tenderness. EXTREMITIES: No cyanosis, clubbing, or edema, 2+ distal pulses.    Assessment:    Healthy female exam.      Plan:    Pap smear collected STI  screening and BV/yeast collected Pelvic ultrasound ordered Patient will be contacted with abnormal results See After Visit Summary for Counseling Recommendations

## 2020-02-08 LAB — CERVICOVAGINAL ANCILLARY ONLY
Bacterial Vaginitis (gardnerella): POSITIVE — AB
Candida Glabrata: NEGATIVE
Candida Vaginitis: NEGATIVE
Chlamydia: NEGATIVE
Comment: NEGATIVE
Comment: NEGATIVE
Comment: NEGATIVE
Comment: NEGATIVE
Comment: NEGATIVE
Comment: NORMAL
Neisseria Gonorrhea: NEGATIVE
Trichomonas: POSITIVE — AB

## 2020-02-10 ENCOUNTER — Telehealth: Payer: Self-pay

## 2020-02-10 LAB — CYTOLOGY - PAP
Comment: NEGATIVE
Diagnosis: NEGATIVE
High risk HPV: NEGATIVE

## 2020-02-10 MED ORDER — METRONIDAZOLE 500 MG PO TABS: 500.0000 mg | ORAL_TABLET | Freq: Two times a day (BID) | ORAL | 0 refills | Status: DC

## 2020-02-10 NOTE — Telephone Encounter (Signed)
-----   Message from Mora Bellman, MD sent at 02/10/2020  9:04 AM EDT ----- Please inform patient of both BV and trichomonas infection. Her partner needs to be informed and treated for trichomonas which is considered to be an STD. Rx has been sent to her pharmacy

## 2020-02-10 NOTE — Telephone Encounter (Signed)
Pt notified of +BV and [place]  Pt stated she has not had intercourse x 37yr  Pt advised to take treatment Rx or schedule lab visit to retest Pt agreeable with taking Rx Made aware to call back for TOC appt. in 4-6 wks Pt voiced understanding,

## 2020-02-10 NOTE — Addendum Note (Signed)
Addended by: Mora Bellman on: 02/10/2020 09:04 AM   Modules accepted: Orders

## 2020-02-21 ENCOUNTER — Other Ambulatory Visit: Payer: Self-pay

## 2020-02-21 ENCOUNTER — Ambulatory Visit
Admission: RE | Admit: 2020-02-21 | Discharge: 2020-02-21 | Disposition: A | Discharge status: Home / Self Care | Payer: Medicaid Other | Source: Ambulatory Visit | Attending: Obstetrics and Gynecology | Admitting: Obstetrics and Gynecology

## 2020-02-21 DIAGNOSIS — R102 Pelvic and perineal pain: Secondary | ICD-10-CM | POA: Diagnosis present

## 2020-12-01 ENCOUNTER — Other Ambulatory Visit: Payer: Self-pay | Admitting: Internal Medicine

## 2020-12-03 LAB — COMPLETE METABOLIC PANEL WITH GFR
AG Ratio: 1.5 (calc) (ref 1.0–2.5)
ALT: 24 U/L (ref 6–29)
AST: 23 U/L (ref 10–30)
Albumin: 4.1 g/dL (ref 3.6–5.1)
Alkaline phosphatase (APISO): 95 U/L (ref 31–125)
BUN: 7 mg/dL (ref 7–25)
CO2: 23 mmol/L (ref 20–32)
Calcium: 9.3 mg/dL (ref 8.6–10.2)
Chloride: 104 mmol/L (ref 98–110)
Creat: 0.66 mg/dL (ref 0.50–0.97)
Globulin: 2.8 g/dL (calc) (ref 1.9–3.7)
Glucose, Bld: 82 mg/dL (ref 65–99)
Potassium: 4.1 mmol/L (ref 3.5–5.3)
Sodium: 138 mmol/L (ref 135–146)
Total Bilirubin: 0.3 mg/dL (ref 0.2–1.2)
Total Protein: 6.9 g/dL (ref 6.1–8.1)
eGFR: 117 mL/min/{1.73_m2} (ref >=60)

## 2020-12-03 LAB — LIPID PANEL
Cholesterol: 170 mg/dL (ref <200)
HDL: 46 mg/dL — ABNORMAL LOW (ref >=50)
LDL Cholesterol (Calc): 94 mg/dL (calc)
Non-HDL Cholesterol (Calc): 124 mg/dL (calc) (ref <130)
Total CHOL/HDL Ratio: 3.7 (calc) (ref <5.0)
Triglycerides: 198 mg/dL — ABNORMAL HIGH (ref <150)

## 2020-12-03 LAB — CBC
HCT: 37 % (ref 35.0–45.0)
Hemoglobin: 12.2 g/dL (ref 11.7–15.5)
MCH: 28.4 pg (ref 27.0–33.0)
MCHC: 33 g/dL (ref 32.0–36.0)
MCV: 86 fL (ref 80.0–100.0)
MPV: 9.5 fL (ref 7.5–12.5)
Platelets: 297 10*3/uL (ref 140–400)
RBC: 4.3 10*6/uL (ref 3.80–5.10)
RDW: 13.4 % (ref 11.0–15.0)
WBC: 7.8 10*3/uL (ref 3.8–10.8)

## 2020-12-03 LAB — RHEUMATOID FACTOR: Rheumatoid fact SerPl-aCnc: <14 IU/mL (ref <14)

## 2020-12-03 LAB — URIC ACID: Uric Acid, Serum: 3.2 mg/dL (ref 2.5–7.0)

## 2020-12-03 LAB — TSH: TSH: 1.28 mIU/L

## 2020-12-03 LAB — SEDIMENTATION RATE: Sed Rate: 14 mm/h (ref 0–20)

## 2020-12-03 LAB — VITAMIN D 25 HYDROXY (VIT D DEFICIENCY, FRACTURES): Vit D, 25-Hydroxy: 23 ng/mL — ABNORMAL LOW (ref 30–100)

## 2020-12-03 LAB — ANA: Anti Nuclear Antibody (ANA): NEGATIVE

## 2021-03-05 ENCOUNTER — Ambulatory Visit: Payer: Medicaid Other

## 2021-03-07 ENCOUNTER — Other Ambulatory Visit (HOSPITAL_COMMUNITY)
Admission: RE | Admit: 2021-03-07 | Discharge: 2021-03-07 | Disposition: A | Discharge status: Home / Self Care | Payer: Medicaid Other | Source: Ambulatory Visit | Attending: Obstetrics and Gynecology | Admitting: Obstetrics and Gynecology

## 2021-03-07 ENCOUNTER — Other Ambulatory Visit: Payer: Self-pay

## 2021-03-07 ENCOUNTER — Ambulatory Visit (INDEPENDENT_AMBULATORY_CARE_PROVIDER_SITE_OTHER): Payer: Medicaid Other | Admitting: *Deleted

## 2021-03-07 DIAGNOSIS — N898 Other specified noninflammatory disorders of vagina: Secondary | ICD-10-CM

## 2021-03-07 NOTE — Progress Notes (Signed)
SUBJECTIVE:  35 y.o. female complains of vaginal irritation for 1 week(s). Denies abnormal vaginal bleeding or significant pelvic pain or fever. No UTI symptoms. Denies history of known exposure to STD.  No LMP recorded.  OBJECTIVE:  She appears well, afebrile. Urine dipstick: not done.  ASSESSMENT:  Vaginal Discharge  Vaginal Itching   PLAN:  GC, chlamydia, trichomonas, BVAG, CVAG probe sent to lab. Treatment: To be determined once lab results are received ROV prn if symptoms persist or worsen.

## 2021-03-07 NOTE — Progress Notes (Signed)
Agree with nurses's documentation of this patient's clinic encounter.  Shaine Newmark L, MD  

## 2021-03-08 LAB — CERVICOVAGINAL ANCILLARY ONLY
Bacterial Vaginitis (gardnerella): POSITIVE — AB
Candida Glabrata: NEGATIVE
Candida Vaginitis: POSITIVE — AB
Chlamydia: NEGATIVE
Comment: NEGATIVE
Comment: NEGATIVE
Comment: NEGATIVE
Comment: NEGATIVE
Comment: NEGATIVE
Comment: NORMAL
Neisseria Gonorrhea: NEGATIVE
Trichomonas: NEGATIVE

## 2021-03-09 ENCOUNTER — Telehealth: Payer: Self-pay

## 2021-03-09 ENCOUNTER — Other Ambulatory Visit: Payer: Self-pay

## 2021-03-09 DIAGNOSIS — N898 Other specified noninflammatory disorders of vagina: Secondary | ICD-10-CM

## 2021-03-09 MED ORDER — METRONIDAZOLE 500 MG PO TABS: 500.0000 mg | ORAL_TABLET | Freq: Two times a day (BID) | ORAL | 0 refills | Status: AC

## 2021-03-09 MED ORDER — FLUCONAZOLE 150 MG PO TABS: 150.0000 mg | ORAL_TABLET | Freq: Once | ORAL | 3 refills | Status: AC

## 2021-03-09 NOTE — Telephone Encounter (Signed)
-----   Message from Chancy Milroy, MD sent at 03/09/2021 10:07 AM EST ----- Please send in Rx for BV and yeast per protocol. Pt is aware.  Thanks  Legrand Como

## 2021-03-09 NOTE — Telephone Encounter (Signed)
Pt called and given lab results. Rx sent  Pt has no questions/concerns at this time.

## 2021-04-04 ENCOUNTER — Other Ambulatory Visit: Payer: Self-pay | Admitting: Orthopedic Surgery

## 2021-04-19 ENCOUNTER — Encounter (HOSPITAL_BASED_OUTPATIENT_CLINIC_OR_DEPARTMENT_OTHER): Payer: Self-pay | Admitting: Orthopedic Surgery

## 2021-05-01 ENCOUNTER — Encounter (HOSPITAL_BASED_OUTPATIENT_CLINIC_OR_DEPARTMENT_OTHER): Payer: Self-pay | Admitting: Orthopedic Surgery

## 2021-05-01 ENCOUNTER — Other Ambulatory Visit: Payer: Self-pay

## 2021-05-01 ENCOUNTER — Ambulatory Visit (HOSPITAL_BASED_OUTPATIENT_CLINIC_OR_DEPARTMENT_OTHER)
Admission: RE | Admit: 2021-05-01 | Discharge: 2021-05-01 | Disposition: A | Discharge status: Home / Self Care | Payer: Medicaid Other | Attending: Orthopedic Surgery | Admitting: Orthopedic Surgery

## 2021-05-01 ENCOUNTER — Encounter (HOSPITAL_BASED_OUTPATIENT_CLINIC_OR_DEPARTMENT_OTHER)
Admission: RE | Disposition: A | Discharge status: Home / Self Care | Payer: Self-pay | Source: Home / Self Care | Attending: Orthopedic Surgery

## 2021-05-01 ENCOUNTER — Ambulatory Visit (HOSPITAL_BASED_OUTPATIENT_CLINIC_OR_DEPARTMENT_OTHER): Payer: Medicaid Other | Admitting: Certified Registered"

## 2021-05-01 DIAGNOSIS — G5601 Carpal tunnel syndrome, right upper limb: Secondary | ICD-10-CM | POA: Insufficient documentation

## 2021-05-01 DIAGNOSIS — Z87891 Personal history of nicotine dependence: Secondary | ICD-10-CM | POA: Insufficient documentation

## 2021-05-01 HISTORY — PX: CARPAL TUNNEL RELEASE: SHX101

## 2021-05-01 LAB — POCT PREGNANCY, URINE: Preg Test, Ur: NEGATIVE

## 2021-05-01 SURGERY — CARPAL TUNNEL RELEASE
Anesthesia: Regional | Site: Wrist | Laterality: Right

## 2021-05-01 MED ORDER — FENTANYL CITRATE (PF) 100 MCG/2ML IJ SOLN
INTRAMUSCULAR | Status: DC | PRN
Start: 2021-05-01 — End: 2021-05-01
  Administered 2021-05-01 (×2): 25 ug via INTRAVENOUS
  Administered 2021-05-01: 50 ug via INTRAVENOUS

## 2021-05-01 MED ORDER — HYDROCODONE-ACETAMINOPHEN 5-325 MG PO TABS: ORAL_TABLET | ORAL | 0 refills | Status: DC

## 2021-05-01 MED ORDER — BUPIVACAINE HCL (PF) 0.25 % IJ SOLN
INTRAMUSCULAR | Status: DC | PRN
  Administered 2021-05-01: 9 mL

## 2021-05-01 MED ORDER — OXYCODONE HCL 5 MG/5ML PO SOLN: 5.0000 mg | Freq: Once | ORAL | Status: DC | PRN

## 2021-05-01 MED ORDER — PROPOFOL 500 MG/50ML IV EMUL
INTRAVENOUS | Status: DC | PRN
  Administered 2021-05-01: 75 ug/kg/min via INTRAVENOUS

## 2021-05-01 MED ORDER — CEFAZOLIN SODIUM-DEXTROSE 2-4 GM/100ML-% IV SOLN
INTRAVENOUS | Status: AC
  Filled 2021-05-01: qty 100

## 2021-05-01 MED ORDER — HYDROMORPHONE HCL 1 MG/ML IJ SOLN: 0.2500 mg | INTRAMUSCULAR | Status: DC | PRN

## 2021-05-01 MED ORDER — 0.9 % SODIUM CHLORIDE (POUR BTL) OPTIME
TOPICAL | Status: DC | PRN
  Administered 2021-05-01: 120 mL

## 2021-05-01 MED ORDER — FENTANYL CITRATE (PF) 100 MCG/2ML IJ SOLN
INTRAMUSCULAR | Status: AC
  Filled 2021-05-01: qty 2

## 2021-05-01 MED ORDER — LIDOCAINE HCL (PF) 0.5 % IJ SOLN
INTRAMUSCULAR | Status: DC | PRN
  Administered 2021-05-01: 30 mL via INTRAVENOUS

## 2021-05-01 MED ORDER — MIDAZOLAM HCL 2 MG/2ML IJ SOLN
INTRAMUSCULAR | Status: AC
  Filled 2021-05-01: qty 2

## 2021-05-01 MED ORDER — CEFAZOLIN SODIUM-DEXTROSE 2-4 GM/100ML-% IV SOLN
2.0000 g | INTRAVENOUS | Status: AC
  Administered 2021-05-01: 2 g via INTRAVENOUS

## 2021-05-01 MED ORDER — LACTATED RINGERS IV SOLN: INTRAVENOUS | Status: DC

## 2021-05-01 MED ORDER — OXYCODONE HCL 5 MG PO TABS
5.0000 mg | ORAL_TABLET | Freq: Once | ORAL | Status: DC | PRN
Start: 2021-05-01 — End: 2021-05-01

## 2021-05-01 MED ORDER — BUPIVACAINE HCL (PF) 0.25 % IJ SOLN
INTRAMUSCULAR | Status: AC
  Filled 2021-05-01: qty 30

## 2021-05-01 MED ORDER — MIDAZOLAM HCL 5 MG/5ML IJ SOLN
INTRAMUSCULAR | Status: DC | PRN
  Administered 2021-05-01: 2 mg via INTRAVENOUS

## 2021-05-01 MED ORDER — PROMETHAZINE HCL 25 MG/ML IJ SOLN: 6.2500 mg | INTRAMUSCULAR | Status: DC | PRN

## 2021-05-01 MED ORDER — ONDANSETRON HCL 4 MG/2ML IJ SOLN
INTRAMUSCULAR | Status: DC | PRN
Start: 2021-05-01 — End: 2021-05-01
  Administered 2021-05-01: 4 mg via INTRAVENOUS

## 2021-05-01 SURGICAL SUPPLY — 38 items
APL PRP STRL LF DISP 70% ISPRP (MISCELLANEOUS) ×1
BLADE SURG 15 STRL LF DISP TIS (BLADE) ×2 IMPLANT
BLADE SURG 15 STRL SS (BLADE) ×4
BNDG CMPR 9X4 STRL LF SNTH (GAUZE/BANDAGES/DRESSINGS)
BNDG ELASTIC 3X5.8 VLCR STR LF (GAUZE/BANDAGES/DRESSINGS) ×2 IMPLANT
BNDG ESMARK 4X9 LF (GAUZE/BANDAGES/DRESSINGS) IMPLANT
BNDG GAUZE ELAST 4 BULKY (GAUZE/BANDAGES/DRESSINGS) ×2 IMPLANT
CHLORAPREP W/TINT 26 (MISCELLANEOUS) ×2 IMPLANT
CORD BIPOLAR FORCEPS 12FT (ELECTRODE) ×2 IMPLANT
COVER BACK TABLE 60X90IN (DRAPES) ×2 IMPLANT
COVER MAYO STAND STRL (DRAPES) ×2 IMPLANT
CUFF TOURN SGL QUICK 18 NS (TOURNIQUET CUFF) ×1 IMPLANT
DRAPE EXTREMITY T 121X128X90 (DISPOSABLE) ×2 IMPLANT
DRAPE SURG 17X23 STRL (DRAPES) ×2 IMPLANT
DRSG PAD ABDOMINAL 8X10 ST (GAUZE/BANDAGES/DRESSINGS) ×2 IMPLANT
GAUZE SPONGE 4X4 12PLY STRL (GAUZE/BANDAGES/DRESSINGS) ×2 IMPLANT
GAUZE XEROFORM 1X8 LF (GAUZE/BANDAGES/DRESSINGS) ×2 IMPLANT
GLOVE SRG 8 PF TXTR STRL LF DI (GLOVE) ×1 IMPLANT
GLOVE SURG ENC MOIS LTX SZ7.5 (GLOVE) ×2 IMPLANT
GLOVE SURG POLYISO LF SZ6.5 (GLOVE) ×1 IMPLANT
GLOVE SURG UNDER POLY LF SZ6.5 (GLOVE) ×1 IMPLANT
GLOVE SURG UNDER POLY LF SZ7 (GLOVE) ×1 IMPLANT
GLOVE SURG UNDER POLY LF SZ8 (GLOVE) ×2
GOWN STRL REUS W/ TWL LRG LVL3 (GOWN DISPOSABLE) ×1 IMPLANT
GOWN STRL REUS W/TWL LRG LVL3 (GOWN DISPOSABLE) ×2
GOWN STRL REUS W/TWL XL LVL3 (GOWN DISPOSABLE) ×2 IMPLANT
NDL HYPO 25X1 1.5 SAFETY (NEEDLE) ×1 IMPLANT
NEEDLE HYPO 25X1 1.5 SAFETY (NEEDLE) ×2 IMPLANT
NS IRRIG 1000ML POUR BTL (IV SOLUTION) ×2 IMPLANT
PACK BASIN DAY SURGERY FS (CUSTOM PROCEDURE TRAY) ×2 IMPLANT
PADDING CAST ABS 4INX4YD NS (CAST SUPPLIES) ×1
PADDING CAST ABS COTTON 4X4 ST (CAST SUPPLIES) ×1 IMPLANT
STOCKINETTE 4X48 STRL (DRAPES) ×2 IMPLANT
SUT ETHILON 4 0 PS 2 18 (SUTURE) ×2 IMPLANT
SYR BULB EAR ULCER 3OZ GRN STR (SYRINGE) ×2 IMPLANT
SYR CONTROL 10ML LL (SYRINGE) ×2 IMPLANT
TOWEL GREEN STERILE FF (TOWEL DISPOSABLE) ×4 IMPLANT
UNDERPAD 30X36 HEAVY ABSORB (UNDERPADS AND DIAPERS) ×2 IMPLANT

## 2021-05-01 NOTE — Anesthesia Postprocedure Evaluation (Signed)
Anesthesia Post Note  Patient: Caitlin Patrick  Procedure(s) Performed: RIGHT CARPAL TUNNEL RELEASE (Right: Wrist)     Patient location during evaluation: PACU Anesthesia Type: Bier Block Level of consciousness: awake and alert Pain management: pain level controlled Vital Signs Assessment: post-procedure vital signs reviewed and stable Respiratory status: spontaneous breathing, nonlabored ventilation and respiratory function stable Cardiovascular status: blood pressure returned to baseline and stable Postop Assessment: no apparent nausea or vomiting Anesthetic complications: no   No notable events documented.  Last Vitals:  Vitals:   05/01/21 1400 05/01/21 1415  BP: 114/81 123/88  Pulse: (!) 49 (!) 58  Resp: 12 14  Temp:    SpO2: 100% 100%    Last Pain:  Vitals:   05/01/21 1353  TempSrc:   PainSc: 0-No pain                 Lynda Rainwater

## 2021-05-01 NOTE — Discharge Instructions (Addendum)

## 2021-05-01 NOTE — Op Note (Signed)
05/01/2021 [facility]                              OPERATIVE REPORT   PREOPERATIVE DIAGNOSIS:  Right carpal tunnel syndrome.  POSTOPERATIVE DIAGNOSIS:  Right carpal tunnel syndrome.  PROCEDURE:  Right carpal tunnel release.  SURGEON:  Leanora Cover, MD  ASSISTANT:  none.  ANESTHESIA: Bier block with sedation  IV FLUIDS:  Per anesthesia flow sheet.  ESTIMATED BLOOD LOSS:  Minimal.  COMPLICATIONS:  None.  SPECIMENS:  None.  TOURNIQUET TIME:    Total Tourniquet Time Documented: Forearm (Right) - 23 minutes Total: Forearm (Right) - 23 minutes   DISPOSITION:  Stable to PACU.  LOCATION: [facility]  INDICATIONS:  36 yo female with numbness and tingling right hand.  Positive nerve conduction studies.  She wishes to have a carpal tunnel release for management of her symptoms.  Risks, benefits and alternatives of surgery were discussed including the risk of blood loss; infection; damage to nerves, vessels, tendons, ligaments, bone; failure of surgery; need for additional surgery; complications with wound healing; continued pain; recurrence of carpal tunnel syndrome; and damage to motor branch. She voiced understanding of these risks and elected to proceed.   OPERATIVE COURSE:  After being identified preoperatively by myself, the patient and I agreed upon the procedure and site of procedure.  The surgical site was marked.  Surgical consent had been signed.  She was given preoperative IV antibiotic prophylaxis.  She was transferred to the operating room and placed on the operating room table in supine position with the Right upper extremity on an armboard.  Bier block anesthesia was induced by the anesthesiologist.  Right upper extremity was prepped and draped in normal sterile orthopaedic fashion.  A surgical pause was performed between the surgeons, anesthesia, and operating room staff, and all were in agreement as to the patient, procedure, and site of  procedure.  Tourniquet at the proximal aspect of the forearm had been inflated for the Bier block  Incision was made over the transverse carpal ligament and carried into the subcutaneous tissues by spreading technique.  Bipolar electrocautery was used to obtain hemostasis.  The palmar fascia was sharply incised.  The transverse carpal ligament was identified and sharply incised.  It was incised distally first.  The flexor tendons were identified.  The flexor tendon to the little finger was identified and retracted radially.  The transverse carpal ligament was then incised proximally.  Scissors were used to split the distal aspect of the volar antebrachial fascia.  A finger was placed into the wound to ensure complete decompression, which was the case.  The nerve was examined.  It was adherent to the radial leaflet.  The motor branch was identified and was intact.  The wound was copiously irrigated with sterile saline.  It was then closed with 4-0 nylon in a horizontal mattress fashion.  It was injected with 0.25% plain Marcaine to aid in postoperative analgesia.  It was dressed with sterile Xeroform, 4x4s, an ABD, and wrapped with Kerlix and an Ace bandage.  Tourniquet was deflated at 23 minutes.  Fingertips were pink with brisk capillary refill after deflation of the tourniquet.  Operative drapes were broken down.  The patient was awoken from anesthesia safely.  She was transferred back to stretcher and taken to the PACU in stable condition.  I will see her back in the office in 1 week for postoperative  followup.  I will give her a prescription for Norco 5/325 1-2 tabs PO q6 hours prn pain, dispense # 15.    Leanora Cover, MD Electronically signed, 05/01/21

## 2021-05-01 NOTE — Transfer of Care (Signed)
Immediate Anesthesia Transfer of Care Note  Patient: Caitlin Patrick  Procedure(s) Performed: RIGHT CARPAL TUNNEL RELEASE (Right: Wrist)  Patient Location: PACU  Anesthesia Type:MAC and Bier block  Level of Consciousness: awake, alert  and oriented  Airway & Oxygen Therapy: Patient Spontanous Breathing and Patient connected to face mask oxygen  Post-op Assessment: Report given to RN and Post -op Vital signs reviewed and stable  Post vital signs: Reviewed and stable  Last Vitals:  Vitals Value Taken Time  BP    Temp    Pulse    Resp 15 05/01/21 1353  SpO2    Vitals shown include unvalidated device data.  Last Pain:  Vitals:   05/01/21 1213  TempSrc: Oral  PainSc: 0-No pain      Patients Stated Pain Goal: 7 (03/08/34 6701)  Complications: No notable events documented.

## 2021-05-01 NOTE — H&P (Signed)
Caitlin Patrick is an 36 y.o. female.   Chief Complaint: carpal tunnel syndrome HPI: 36 yo female with numbness and tingling right hand.  Nocturnal symptoms.  Positive nerve conduction studies.  She wishes to have right carpal tunnel release.  Allergies: No Known Allergies  Past Medical History:  Diagnosis Date   Sickle cell trait (HCC)    Vaginal Pap smear, abnormal     Past Surgical History:  Procedure Laterality Date   CESAREAN SECTION     MULTIPLE TOOTH EXTRACTIONS Left    WISDOM TOOTH EXTRACTION      Family History: Family History  Problem Relation Age of Onset   Breast cancer Mother    Cancer Mother    Hypertension Father    Diabetes Maternal Grandmother    Diabetes Paternal Grandmother     Social History:   reports that she has quit smoking. She has never used smokeless tobacco. She reports that she does not currently use alcohol. She reports current drug use. Drug: Marijuana.  Medications: No medications prior to admission.    Results for orders placed or performed during the hospital encounter of 05/01/21 (from the past 48 hour(s))  Pregnancy, urine POC     Status: None   Collection Time: 05/01/21 12:02 PM  Result Value Ref Range   Preg Test, Ur NEGATIVE NEGATIVE    Comment:        THE SENSITIVITY OF THIS METHODOLOGY IS >24 mIU/mL     No results found.    Height 5\' 11"  (1.803 m), weight 97.5 kg, last menstrual period 04/13/2021.  General appearance: alert, cooperative, and appears stated age Head: Normocephalic, without obvious abnormality, atraumatic Neck: supple, symmetrical, trachea midline Cardio: regular rate and rhythm Resp: clear to auscultation bilaterally Extremities: Intact sensation and capillary refill all digits.  +epl/fpl/io.  No wounds.  Pulses: 2+ and symmetric Skin: Skin color, texture, turgor normal. No rashes or lesions Neurologic: Grossly normal Incision/Wound: none  Assessment/Plan Right carpal tunnel syndrome.  Non  operative and operative treatment options have been discussed with the patient and patient wishes to proceed with operative treatment. Risks, benefits, and alternatives of surgery have been discussed and the patient agrees with the plan of care.   Leanora Cover 05/01/2021, 12:04 PM

## 2021-05-01 NOTE — Anesthesia Preprocedure Evaluation (Addendum)
Anesthesia Evaluation  Patient identified by MRN, date of birth, ID band Patient awake    Reviewed: Allergy & Precautions, NPO status , Patient's Chart, lab work & pertinent test results  Airway Mallampati: II  TM Distance: >3 FB Neck ROM: Full    Dental no notable dental hx.    Pulmonary neg pulmonary ROS, former smoker,    Pulmonary exam normal breath sounds clear to auscultation       Cardiovascular negative cardio ROS Normal cardiovascular exam Rhythm:Regular Rate:Normal     Neuro/Psych negative neurological ROS  negative psych ROS   GI/Hepatic negative GI ROS, Neg liver ROS,   Endo/Other  negative endocrine ROS  Renal/GU negative Renal ROS  negative genitourinary   Musculoskeletal negative musculoskeletal ROS (+)   Abdominal   Peds negative pediatric ROS (+)  Hematology negative hematology ROS (+)   Anesthesia Other Findings   Reproductive/Obstetrics negative OB ROS                             Anesthesia Physical Anesthesia Plan  ASA: 2  Anesthesia Plan: Bier Block and Bier Block-LIDOCAINE ONLY   Post-op Pain Management:    Induction: Intravenous  PONV Risk Score and Plan: 2 and Ondansetron, Midazolam and Treatment may vary due to age or medical condition  Airway Management Planned: Simple Face Mask  Additional Equipment:   Intra-op Plan:   Post-operative Plan:   Informed Consent: I have reviewed the patients History and Physical, chart, labs and discussed the procedure including the risks, benefits and alternatives for the proposed anesthesia with the patient or authorized representative who has indicated his/her understanding and acceptance.     Dental advisory given  Plan Discussed with: CRNA  Anesthesia Plan Comments:         Anesthesia Quick Evaluation

## 2021-05-01 NOTE — Anesthesia Procedure Notes (Signed)
Anesthesia Regional Block: Bier block (IV Regional)   Pre-Anesthetic Checklist: , timeout performed,  Correct Patient, Correct Site, Correct Laterality,  Correct Procedure, Correct Position, site marked,  Risks and benefits discussed,  Surgical consent,  Pre-op evaluation,  At surgeon's request  Laterality: Right  Prep: alcohol swabs        Procedures:,,,,,, single tourniquet utilized,  #20gu IV placed    Narrative:   Performed by: Personally  CRNA: Verita Lamb, CRNA

## 2021-05-02 ENCOUNTER — Encounter (HOSPITAL_BASED_OUTPATIENT_CLINIC_OR_DEPARTMENT_OTHER): Payer: Self-pay | Admitting: Orthopedic Surgery

## 2021-05-09 ENCOUNTER — Telehealth: Payer: Self-pay

## 2021-05-09 NOTE — Telephone Encounter (Signed)
Returned call and pt is requesting rx for yeast to be sent. Pt reports vaginal itching and irritation. Advised pt that last annual was 02-07-2020, so she needs an office visit, pt voiced understanding. Advised of OTC yeast meds and scheduler will call.

## 2021-06-13 ENCOUNTER — Ambulatory Visit: Payer: Medicaid Other | Admitting: Obstetrics and Gynecology

## 2021-08-01 ENCOUNTER — Other Ambulatory Visit: Payer: Self-pay

## 2021-08-01 ENCOUNTER — Encounter (HOSPITAL_BASED_OUTPATIENT_CLINIC_OR_DEPARTMENT_OTHER): Payer: Self-pay

## 2021-08-01 ENCOUNTER — Emergency Department (HOSPITAL_BASED_OUTPATIENT_CLINIC_OR_DEPARTMENT_OTHER): Payer: Managed Care, Other (non HMO)

## 2021-08-01 ENCOUNTER — Emergency Department (HOSPITAL_BASED_OUTPATIENT_CLINIC_OR_DEPARTMENT_OTHER)
Admission: EM | Admit: 2021-08-01 | Discharge: 2021-08-01 | Disposition: A | Discharge status: Home / Self Care | Payer: Managed Care, Other (non HMO) | Attending: Emergency Medicine | Admitting: Emergency Medicine

## 2021-08-01 DIAGNOSIS — R112 Nausea with vomiting, unspecified: Secondary | ICD-10-CM | POA: Diagnosis present

## 2021-08-01 DIAGNOSIS — K529 Noninfective gastroenteritis and colitis, unspecified: Secondary | ICD-10-CM | POA: Insufficient documentation

## 2021-08-01 LAB — CBC WITH DIFFERENTIAL/PLATELET
Abs Immature Granulocytes: 0.02 10*3/uL (ref 0.00–0.07)
Basophils Absolute: 0 10*3/uL (ref 0.0–0.1)
Basophils Relative: 0 %
Eosinophils Absolute: 0 10*3/uL (ref 0.0–0.5)
Eosinophils Relative: 0 %
HCT: 36.7 % (ref 36.0–46.0)
Hemoglobin: 12.4 g/dL (ref 12.0–15.0)
Immature Granulocytes: 0 %
Lymphocytes Relative: 15 %
Lymphs Abs: 1.3 10*3/uL (ref 0.7–4.0)
MCH: 28.3 pg (ref 26.0–34.0)
MCHC: 33.8 g/dL (ref 30.0–36.0)
MCV: 83.8 fL (ref 80.0–100.0)
Monocytes Absolute: 0.4 10*3/uL (ref 0.1–1.0)
Monocytes Relative: 4 %
Neutro Abs: 7.1 10*3/uL (ref 1.7–7.7)
Neutrophils Relative %: 81 %
Platelets: 303 10*3/uL (ref 150–400)
RBC: 4.38 MIL/uL (ref 3.87–5.11)
RDW: 13.4 % (ref 11.5–15.5)
WBC: 8.9 10*3/uL (ref 4.0–10.5)
nRBC: 0 % (ref 0.0–0.2)

## 2021-08-01 LAB — COMPREHENSIVE METABOLIC PANEL WITH GFR
ALT: 31 U/L (ref 0–44)
AST: 20 U/L (ref 15–41)
Albumin: 4.6 g/dL (ref 3.5–5.0)
Alkaline Phosphatase: 92 U/L (ref 38–126)
Anion gap: 12 (ref 5–15)
BUN: 9 mg/dL (ref 6–20)
CO2: 24 mmol/L (ref 22–32)
Calcium: 9.8 mg/dL (ref 8.9–10.3)
Chloride: 100 mmol/L (ref 98–111)
Creatinine, Ser: 0.68 mg/dL (ref 0.44–1.00)
GFR, Estimated: >60 mL/min
Glucose, Bld: 108 mg/dL — ABNORMAL HIGH (ref 70–99)
Potassium: 3.8 mmol/L (ref 3.5–5.1)
Sodium: 136 mmol/L (ref 135–145)
Total Bilirubin: 0.5 mg/dL (ref 0.3–1.2)
Total Protein: 8 g/dL (ref 6.5–8.1)

## 2021-08-01 LAB — HCG, SERUM, QUALITATIVE: Preg, Serum: NEGATIVE

## 2021-08-01 LAB — LIPASE, BLOOD: Lipase: 13 U/L (ref 11–51)

## 2021-08-01 MED ORDER — SODIUM CHLORIDE 0.9 % IV SOLN: INTRAVENOUS | Status: DC

## 2021-08-01 MED ORDER — SODIUM CHLORIDE 0.9 % IV BOLUS
1000.0000 mL | Freq: Once | INTRAVENOUS | Status: AC
  Administered 2021-08-01: 1000 mL via INTRAVENOUS

## 2021-08-01 MED ORDER — IOHEXOL 300 MG/ML  SOLN
100.0000 mL | Freq: Once | INTRAMUSCULAR | Status: AC | PRN
  Administered 2021-08-01: 80 mL via INTRAVENOUS

## 2021-08-01 MED ORDER — ONDANSETRON 4 MG PO TBDP: 4.0000 mg | ORAL_TABLET | Freq: Three times a day (TID) | ORAL | 0 refills | Status: DC | PRN

## 2021-08-01 MED ORDER — LOPERAMIDE HCL 2 MG PO CAPS: 2.0000 mg | ORAL_CAPSULE | Freq: Four times a day (QID) | ORAL | 0 refills | Status: DC | PRN

## 2021-08-01 MED ORDER — ONDANSETRON HCL 4 MG/2ML IJ SOLN
4.0000 mg | Freq: Once | INTRAMUSCULAR | Status: AC
  Administered 2021-08-01: 4 mg via INTRAVENOUS
  Filled 2021-08-01: qty 2

## 2021-08-01 NOTE — ED Triage Notes (Signed)
Patient c/o bilat upper abd pain/cramping since last night/ N/V/D since then as well. Reports bloody stools. Attempted pepto, but was unable to keep it down  ?

## 2021-08-01 NOTE — Discharge Instructions (Signed)
Work-up here today normal blood counts were normal.  CT scan without any acute findings.  If you have increased rectal bleeding that stains the toilet water all red x2 in a day need to get seen.  Take the Zofran for the nausea vomiting.  Take the Imodium to help slow down the diarrhea.  Return for any new or worse symptoms otherwise follow-up with your primary care doctor. ?

## 2021-08-01 NOTE — ED Provider Notes (Addendum)
?[facility] ?Provider Note ? ? ?CSN: [id number] ?Arrival date & time: 08/01/21  0539 ? ?  ? ?History ? ?Chief Complaint  ?Patient presents with  ? Abdominal Pain  ? Diarrhea  ? ? ?Caitlin Patrick is a 36 y.o. female. ? ?Patient with acute new onset crampy abdominal pain nausea vomiting and diarrhea at 2300 last evening sat multiple episodes of vomiting last 2 diarrheal bowel movements have a little bit of blood in it but did not stay in the toilet water all red.  It was red in color.  Patient felt fine yesterday.  Medical history significant for sickle cell trait.  Without any upper respiratory symptoms. ? ? ?  ? ?Home Medications ?Prior to Admission medications   ?Medication Sig Start Date End Date Taking? Authorizing Provider  ?HYDROcodone-acetaminophen (NORCO/VICODIN) 5-325 MG tablet 1-2 tabs PO q6 hours prn pain 05/01/21   Leanora Cover, MD  ?   ? ?Allergies    ?Patient has no known allergies.   ? ?Review of Systems   ?Review of Systems  ?Constitutional:  Negative for chills and fever.  ?HENT:  Negative for ear pain and sore throat.   ?Eyes:  Negative for pain and visual disturbance.  ?Respiratory:  Negative for cough and shortness of breath.   ?Cardiovascular:  Negative for chest pain and palpitations.  ?Gastrointestinal:  Positive for abdominal pain, blood in stool, diarrhea, nausea and vomiting.  ?Genitourinary:  Negative for dysuria and hematuria.  ?Musculoskeletal:  Negative for arthralgias and back pain.  ?Skin:  Negative for color change and rash.  ?Neurological:  Negative for seizures and syncope.  ?All other systems reviewed and are negative. ? ?Physical Exam ?Updated Vital Signs ?BP 137/87   Pulse 78   Temp 97.8 ?F (36.6 ?C)   Resp 16   Ht 1.803 m ('5\' 11"'$ )   Wt 99.8 kg   SpO2 100%   BMI 30.68 kg/m?  ?Physical Exam ?Vitals and nursing note reviewed.  ?Constitutional:   ?   General: She is not in acute distress. ?   Appearance: Normal appearance. She is well-developed.   ?HENT:  ?   Head: Normocephalic and atraumatic.  ?Eyes:  ?   Extraocular Movements: Extraocular movements intact.  ?   Conjunctiva/sclera: Conjunctivae normal.  ?   Pupils: Pupils are equal, round, and reactive to light.  ?Cardiovascular:  ?   Rate and Rhythm: Normal rate and regular rhythm.  ?   Heart sounds: No murmur heard. ?Pulmonary:  ?   Effort: Pulmonary effort is normal. No respiratory distress.  ?   Breath sounds: Normal breath sounds.  ?Abdominal:  ?   General: There is no distension.  ?   Palpations: Abdomen is soft.  ?   Tenderness: There is no abdominal tenderness. There is no guarding.  ?Musculoskeletal:     ?   General: No swelling.  ?   Cervical back: Normal range of motion and neck supple.  ?Skin: ?   General: Skin is warm and dry.  ?   Capillary Refill: Capillary refill takes less than 2 seconds.  ?Neurological:  ?   General: No focal deficit present.  ?   Mental Status: She is alert and oriented to person, place, and time.  ?Psychiatric:     ?   Mood and Affect: Mood normal.  ? ? ?ED Results / Procedures / Treatments   ?Labs ?(all labs ordered are listed, but only abnormal results are displayed) ?Labs Reviewed  ?PREGNANCY, URINE  ?  CBC WITH DIFFERENTIAL/PLATELET  ?COMPREHENSIVE METABOLIC PANEL  ?LIPASE, BLOOD  ? ? ?EKG ?None ? ?Radiology ?No results found. ? ?Procedures ?Procedures  ? ? ?Medications Ordered in ED ?Medications  ?0.9 %  sodium chloride infusion (has no administration in time range)  ?sodium chloride 0.9 % bolus 1,000 mL (has no administration in time range)  ?ondansetron (ZOFRAN) injection 4 mg (has no administration in time range)  ? ? ?ED Course/ Medical Decision Making/ A&P ?  ?                        ?Medical Decision Making ?Amount and/or Complexity of Data Reviewed ?Labs: ordered. ?Radiology: ordered. ? ?Risk ?Prescription drug management. ? ? ?Symptoms most likely of gastroenteritis based on the transit of been seen today.  But since she is having pretty significant crampy  abdominal pain and had some blood it may represent a colitis or enteritis.  We will go ahead and get CT scan of the abdomen. ? ?Patient's vital signs are normal here not tachycardic blood pressure is good. ? ?We will give IV fluids check labs make sure she is not pregnant and will do a CT scan of the abdomen for further evaluation. ? ?Amount of blood that the patient is stating has been minimal.  Has not been a large amount did not have blood early along with the diarrhea. ? ?T scan without any acute findings.  CBC normal complete metabolic panel liver function tests normal lipase normal pregnancy test negative.  Symptoms consistent probably due to gastroenteritis.  Will give precautions to return for any increased bleeding.  No evidence of colitis on the CT scan.  Patient nontoxic no acute distress.  Vital signs normal here hemoglobin was normal. ? ? ? ? ?Final Clinical Impression(s) / ED Diagnoses ?Final diagnoses:  ?Gastroenteritis  ? ? ?Rx / DC Orders ?ED Discharge Orders   ? ? None  ? ?  ? ? ?  ?Fredia Sorrow, MD ?08/01/21 0902 ? ?  ?Fredia Sorrow, MD ?08/01/21 1022 ? ?

## 2022-01-08 IMAGING — US US PELVIS COMPLETE WITH TRANSVAGINAL
1 series · 15 of 25 positions shown · non-contrast
Comparison: None

CLINICAL DATA: Pelvic pain, dysmenorrhea, prior Caesarean section,
LMP 02/21/2020

EXAM:
TRANSABDOMINAL AND TRANSVAGINAL ULTRASOUND OF PELVIS
TECHNIQUE: Both transabdominal and transvaginal ultrasound examinations of the
pelvis were performed. Transabdominal technique was performed for
global imaging of the pelvis including uterus, ovaries, adnexal
regions, and pelvic cul-de-sac. It was necessary to proceed with
endovaginal exam following the transabdominal exam to visualize the
ovaries, uterus, cervix, and inferior portion of endometrial
complex.

[Series 1: us pelvis complete with transvaginal · 15 of 109 slices shown]
[im 1/109]
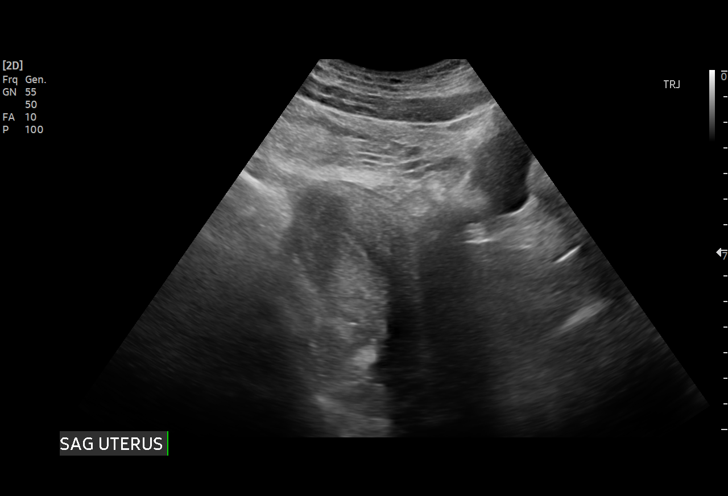
[im 10/109]
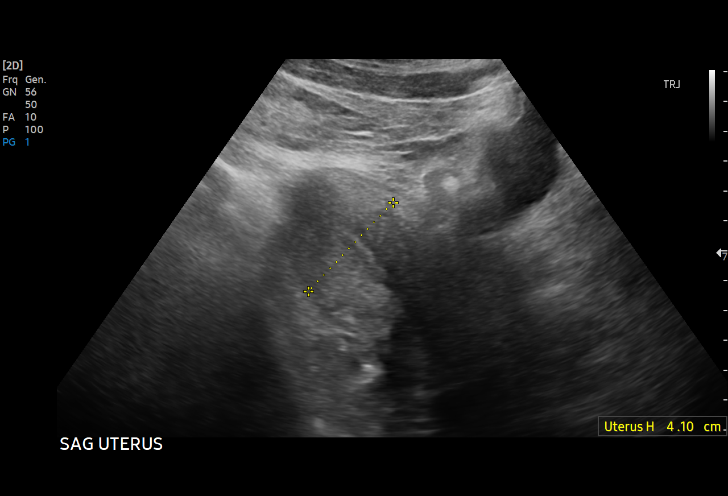
[im 19/109]
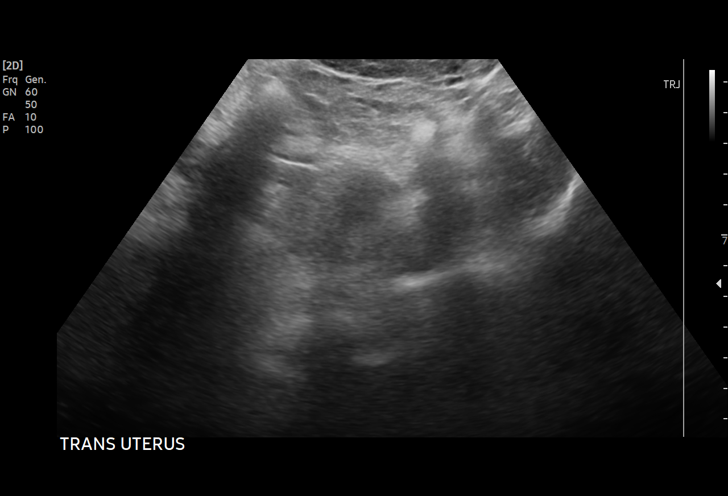
[im 23/109]
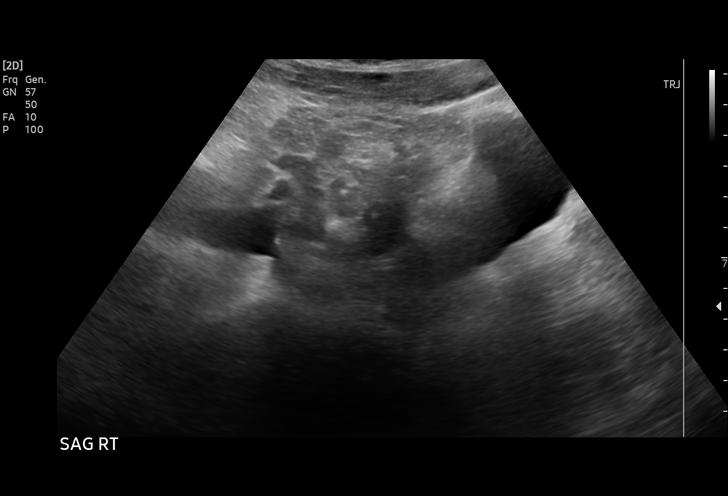
[im 32/109]
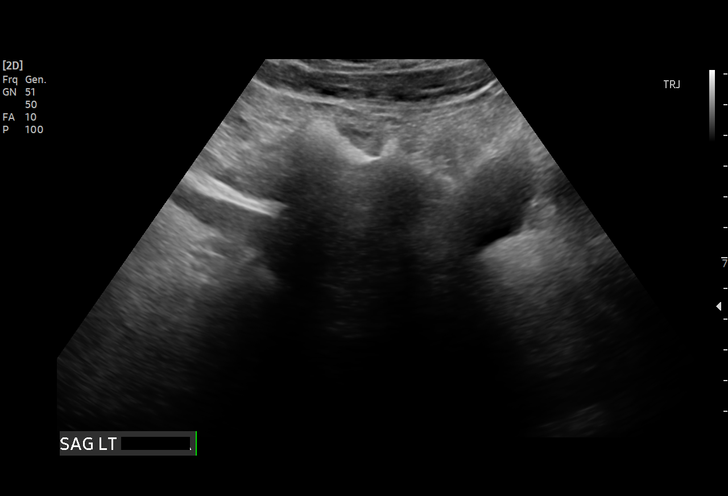
[im 41/109]
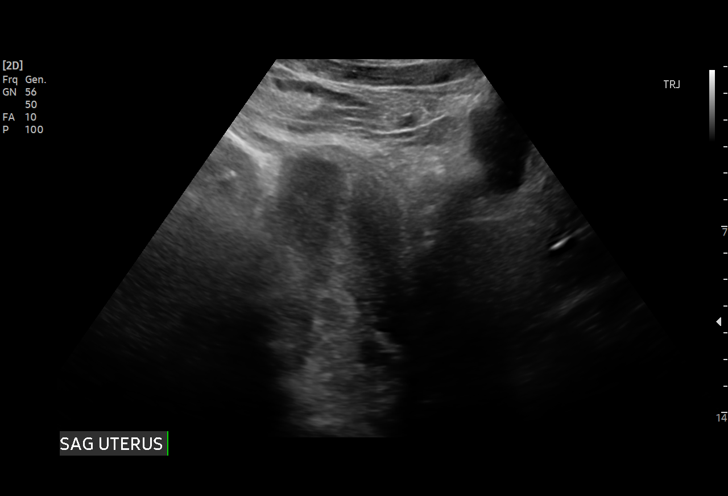
[im 46/109]
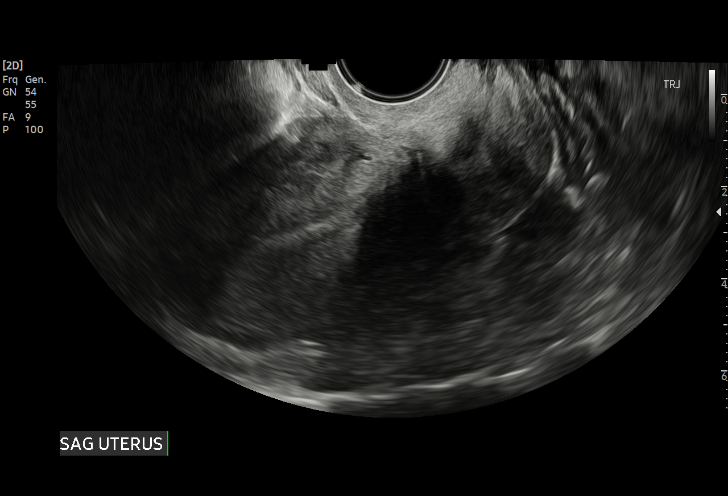
[im 55/109]
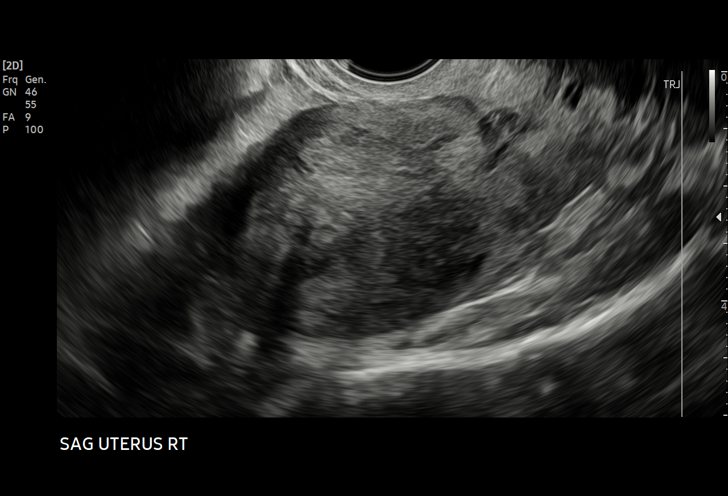
[im 64/109]
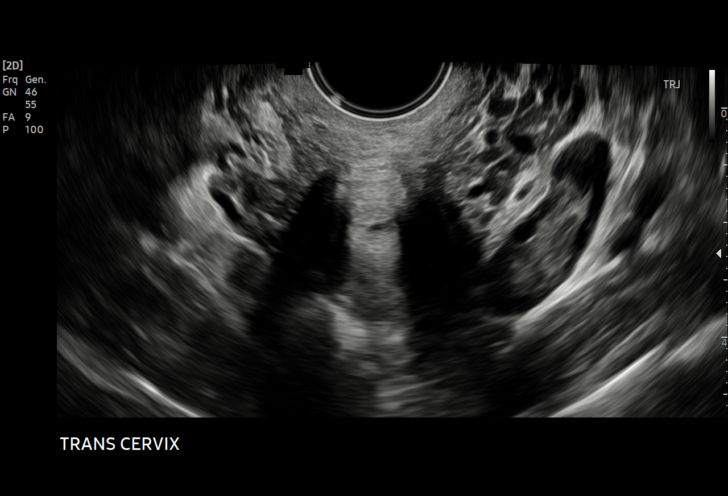
[im 68/109]
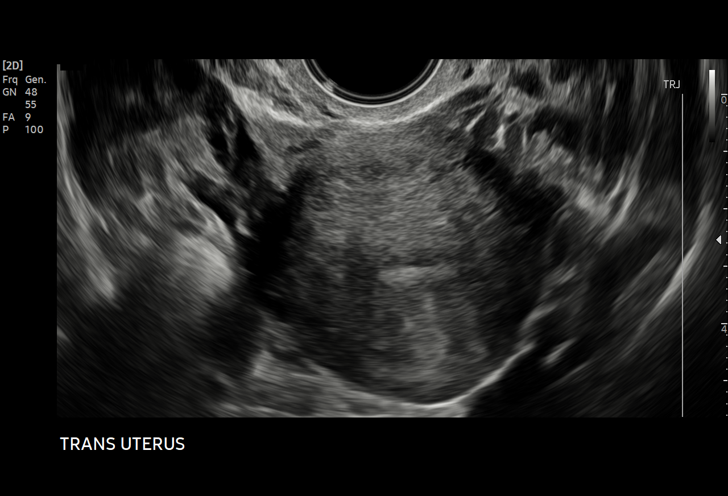
[im 77/109]
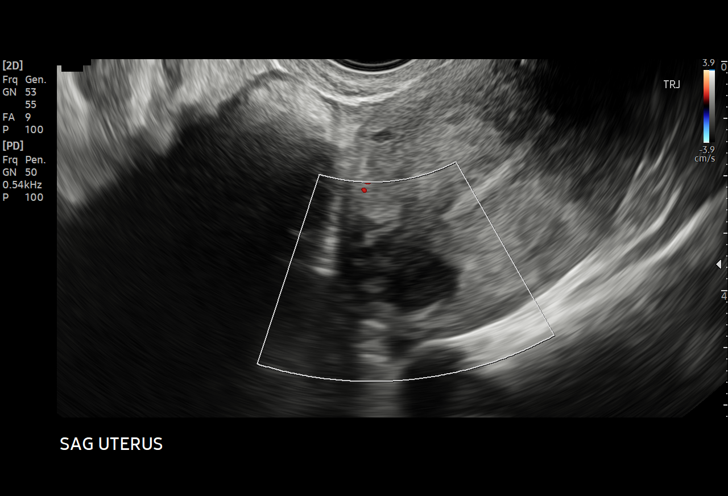
[im 86/109]
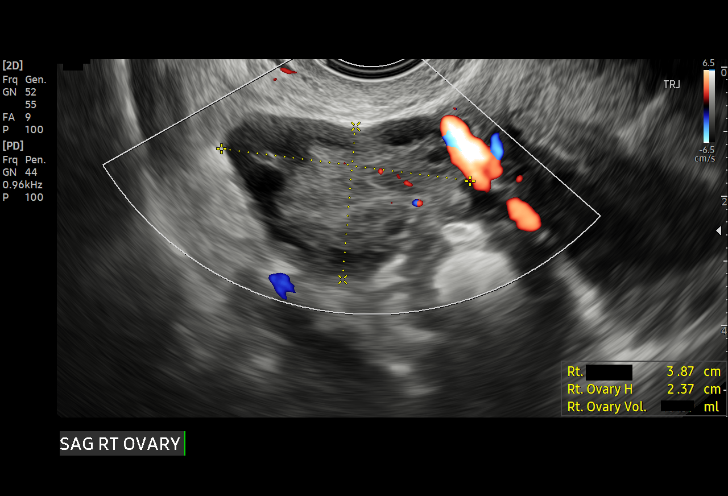
[im 91/109]
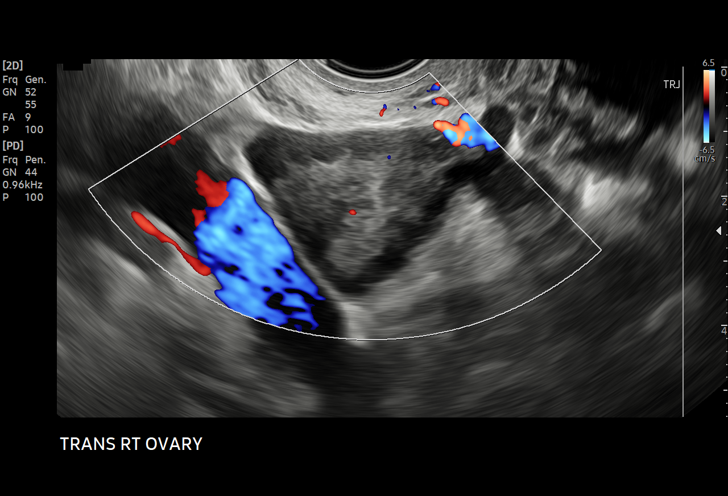
[im 100/109]
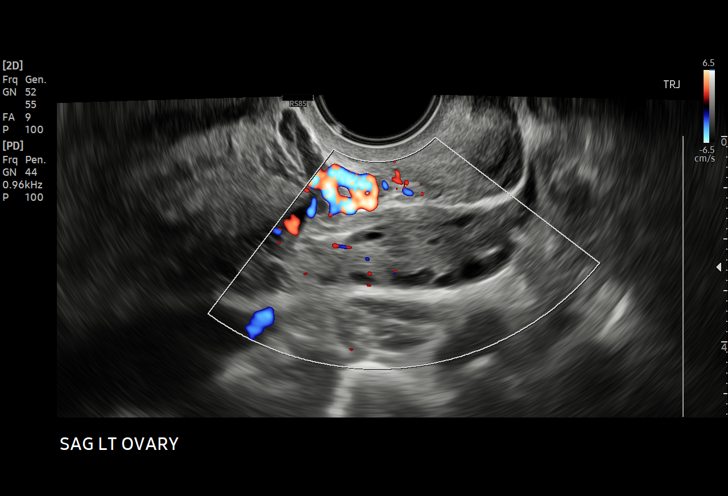
[im 109/109]
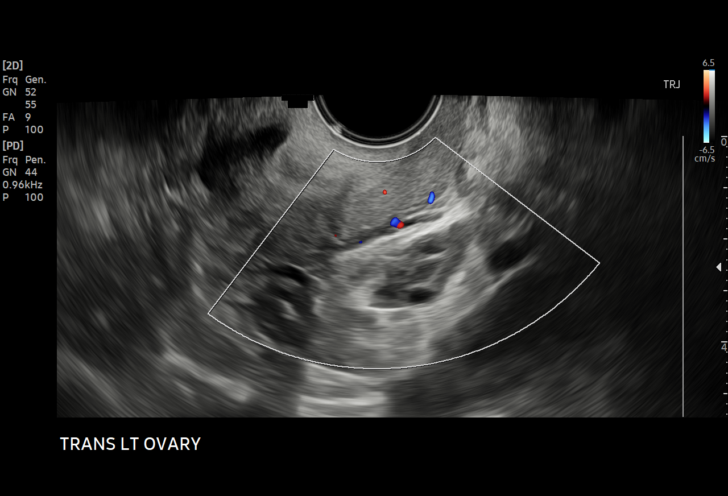

[15 of 25 positions shown; findings below may reference images not displayed]

FINDINGS: Uterus

Measurements: 8.7 x 4.5 x 4.6 cm = volume: 94 mL. Caesarean section
scar. Anteverted. Fundal leiomyoma, intramural, 2.0 x 1.3 x 1.9 cm.
Heterogeneous myometrium. No additional masses.

Endometrium

Thickness: 4 mm.  No endometrial fluid or focal abnormality

Right ovary

Measurements: 3.9 x 2.4 x 3.5 cm = volume: 16.7 mL. Multiple
peripheral follicles. No dominant mass.

Left ovary

Measurements: 3.3 x 1.7 x 2.2 cm = volume: 6.3 mL. Multiple
peripheral follicles. No dominant mass.

Other findings

No free pelvic fluid.  No adnexal masses.
IMPRESSION: Small fundal leiomyoma 2.0 cm greatest size.

Multiple peripheral follicles throughout both ovaries, nonspecific
but can be seen in patients with polycystic ovarian syndrome.

Remainder of exam unremarkable.

## 2022-04-29 ENCOUNTER — Ambulatory Visit (INDEPENDENT_AMBULATORY_CARE_PROVIDER_SITE_OTHER): Payer: Medicaid Other | Admitting: Advanced Practice Midwife

## 2022-04-29 ENCOUNTER — Other Ambulatory Visit: Payer: Self-pay | Admitting: Advanced Practice Midwife

## 2022-04-29 ENCOUNTER — Encounter: Payer: Self-pay | Admitting: Advanced Practice Midwife

## 2022-04-29 VITALS — BP 126/75 | HR 82 | Ht 70.0 in | Wt 214.2 lb

## 2022-04-29 DIAGNOSIS — Z1239 Encounter for other screening for malignant neoplasm of breast: Secondary | ICD-10-CM

## 2022-04-29 DIAGNOSIS — N6323 Unspecified lump in the left breast, lower outer quadrant: Secondary | ICD-10-CM | POA: Diagnosis not present

## 2022-04-29 DIAGNOSIS — D259 Leiomyoma of uterus, unspecified: Secondary | ICD-10-CM

## 2022-04-29 DIAGNOSIS — Z01419 Encounter for gynecological examination (general) (routine) without abnormal findings: Secondary | ICD-10-CM

## 2022-04-29 DIAGNOSIS — G8929 Other chronic pain: Secondary | ICD-10-CM

## 2022-04-29 DIAGNOSIS — R102 Pelvic and perineal pain: Secondary | ICD-10-CM | POA: Diagnosis not present

## 2022-04-29 DIAGNOSIS — R52 Pain, unspecified: Secondary | ICD-10-CM

## 2022-04-29 HISTORY — DX: Leiomyoma of uterus, unspecified: D25.9

## 2022-04-29 MED ORDER — HYDROCODONE-ACETAMINOPHEN 5-325 MG PO TABS: 1.0000 | ORAL_TABLET | Freq: Four times a day (QID) | ORAL | 0 refills | Status: DC | PRN

## 2022-04-29 MED ORDER — NAPROXEN SODIUM 550 MG PO TABS: 550.0000 mg | ORAL_TABLET | Freq: Two times a day (BID) | ORAL | 2 refills | Status: DC

## 2022-04-29 NOTE — Progress Notes (Signed)
Pt presents for pelvic pain. Pt reports cramps when she is not on her period, back pain, spotting, and and discharge. Fibroids found in pelvic US in 2021 and no further treatment. Pain has kept pt out of work and makes it hard to perform ADLs. Periods are irregular.

## 2022-04-29 NOTE — Progress Notes (Signed)
   GYNECOLOGY PROGRESS NOTE  History:  37 y.o. G2P1011 presents to [place] office today for routine exam. She reports pelvic pain that is cramping and intermittent, with menses since 2021 when she was dx with uterine fibroid, and worsening over time. She has recently missed work due to severe pain, not resolved with ibuprofen and Tylenol. She took hydrocodone this week, left over from procedure 1 year ago, and that did help her to sleep.  She denies h/a, dizziness, shortness of breath, n/v, or fever/chills.    The following portions of the patient's history were reviewed and updated as appropriate: allergies, current medications, past family history, past medical history, past social history, past surgical history and problem list. Last pap smear on 02/07/20 was normal, negative HRHPV.  Health Maintenance Due  Topic Date Due   COVID-19 Vaccine (1) Never done   DTaP/Tdap/Td (1 - Tdap) Never done   INFLUENZA VACCINE  Never done     Review of Systems:  Pertinent items are noted in HPI.   Objective:  Physical Exam Blood pressure 126/75, pulse 82, height '5\' 10"'$  (1.778 m), weight 214 lb 3.2 oz (97.2 kg), last menstrual period 04/19/2022. VS reviewed, nursing note reviewed,  Constitutional: well developed, well nourished, no distress HEENT: normocephalic CV: normal rate Pulm/chest wall: normal effort Breast Exam:  exam performed: right breast normal without mass, skin or nipple changes or axillary nodes, left breast with small smooth round mobile mass in lower outer portion of breast, some tenderness to palpation of mass, no skin or nipple changes or axillary nodes Abdomen: soft Neuro: alert and oriented x 3 Skin: warm, dry Psych: affect normal Pelvic exam: Cervix pink, visually closed, without lesion, scant white creamy discharge, vaginal walls and external genitalia normal Bimanual exam: Cervix 0/long/high, firm, anterior, neg CMT, uterus nontender, nonenlarged, adnexa without  tenderness, enlargement, or mass  Assessment & Plan:  1. Mass of lower outer quadrant of left breast --Breast exam today overall normal, with small, smooth round mobile mass in lower mid to outer portion of breast.   --Pt mother had breast cancer   - MM Digital Diagnostic Unilat L; Future - US BREAST COMPLETE UNI LEFT INC AXILLA; Future  2. Chronic pelvic pain in female --Pain x 3 years since dx with fibroid --Pain with periods, but now some pain in between --Pt missing work due to severe pain   - US PELVIC COMPLETE WITH TRANSVAGINAL; Future  3. Uterine leiomyoma, unspecified location --F/U on [place] in 2021 showing fibroid uterus - US PELVIC COMPLETE WITH TRANSVAGINAL; Future  4. Well woman exam with routine gynecological exam   5. Screening for breast cancer using non-mammogram modality   6. Severe pain  - HYDROcodone-acetaminophen (NORCO) 5-325 MG tablet; Take 1-2 tablets by mouth every 6 (six) hours as needed for moderate pain.  Dispense: 10 tablet; Refill: 0   Return in about 3 weeks (around 05/20/2022) for Follow up with MD after outpatient ultrsound, MD only.   Caitlin Patrick, CNM 6:07 PM

## 2022-05-08 ENCOUNTER — Ambulatory Visit
Admission: RE | Admit: 2022-05-08 | Discharge: 2022-05-08 | Disposition: A | Discharge status: Home / Self Care | Payer: Medicaid Other | Source: Ambulatory Visit | Attending: Advanced Practice Midwife | Admitting: Advanced Practice Midwife

## 2022-05-08 ENCOUNTER — Ambulatory Visit
Admission: RE | Admit: 2022-05-08 | Discharge: 2022-05-08 | Disposition: A | Discharge status: Home / Self Care | Payer: Managed Care, Other (non HMO) | Source: Ambulatory Visit | Attending: Advanced Practice Midwife | Admitting: Advanced Practice Midwife

## 2022-05-08 DIAGNOSIS — N6323 Unspecified lump in the left breast, lower outer quadrant: Secondary | ICD-10-CM

## 2022-05-15 ENCOUNTER — Other Ambulatory Visit: Payer: Managed Care, Other (non HMO)

## 2022-05-27 ENCOUNTER — Ambulatory Visit
Admission: RE | Admit: 2022-05-27 | Discharge: 2022-05-27 | Disposition: A | Discharge status: Home / Self Care | Payer: Medicaid Other | Source: Ambulatory Visit | Attending: Advanced Practice Midwife | Admitting: Advanced Practice Midwife

## 2022-05-27 DIAGNOSIS — D259 Leiomyoma of uterus, unspecified: Secondary | ICD-10-CM

## 2022-05-27 DIAGNOSIS — G8929 Other chronic pain: Secondary | ICD-10-CM

## 2022-06-24 ENCOUNTER — Ambulatory Visit: Payer: Medicaid Other | Admitting: Advanced Practice Midwife

## 2022-07-08 ENCOUNTER — Encounter: Payer: Self-pay | Admitting: Advanced Practice Midwife

## 2022-07-08 ENCOUNTER — Ambulatory Visit (INDEPENDENT_AMBULATORY_CARE_PROVIDER_SITE_OTHER): Payer: Medicaid Other | Admitting: Advanced Practice Midwife

## 2022-07-08 VITALS — BP 128/75 | HR 80 | Ht 70.0 in | Wt 215.2 lb

## 2022-07-08 DIAGNOSIS — N940 Mittelschmerz: Secondary | ICD-10-CM | POA: Diagnosis not present

## 2022-07-08 DIAGNOSIS — R102 Pelvic and perineal pain: Secondary | ICD-10-CM | POA: Diagnosis not present

## 2022-07-08 DIAGNOSIS — G8929 Other chronic pain: Secondary | ICD-10-CM | POA: Diagnosis not present

## 2022-07-08 DIAGNOSIS — D259 Leiomyoma of uterus, unspecified: Secondary | ICD-10-CM | POA: Diagnosis not present

## 2022-07-08 DIAGNOSIS — R52 Pain, unspecified: Secondary | ICD-10-CM

## 2022-07-08 MED ORDER — HYDROCODONE-ACETAMINOPHEN 5-325 MG PO TABS: 1.0000 | ORAL_TABLET | Freq: Four times a day (QID) | ORAL | 0 refills | Status: DC | PRN

## 2022-07-08 MED ORDER — NAPROXEN SODIUM 550 MG PO TABS: 550.0000 mg | ORAL_TABLET | Freq: Two times a day (BID) | ORAL | 2 refills | Status: DC

## 2022-07-08 NOTE — Progress Notes (Signed)
Pt presents for follow-up from last visit. Pt states her symptoms are still the same. Naproxen has helped her pain when not on her period. No other concerns at this time.

## 2022-07-08 NOTE — Progress Notes (Signed)
   GYNECOLOGY PROGRESS NOTE  History:  37 y.o. G2P1011 presents to [place] office today for problem gyn visit. She reports pain during periods is still present but is better managed with Naproxen and she has not had to miss work. She does use the Norco 5/325 tablets sparingly when the pain is severe.  She has used the 10 tablets prescribed 80+ days ago and desires a refill.  Now, she has dull lower back pain in between periods, around the time of ovulation and this is bothering her more than the period pain.  She  She denies h/a, dizziness, shortness of breath, n/v, or fever/chills.    The following portions of the patient's history were reviewed and updated as appropriate: allergies, current medications, past family history, past medical history, past social history, past surgical history and problem list. Last pap smear on 02/07/20 was normal, neg HRHPV.  Health Maintenance Due  Topic Date Due   COVID-19 Vaccine (1) Never done   DTaP/Tdap/Td (1 - Tdap) Never done   INFLUENZA VACCINE  Never done     Review of Systems:  Pertinent items are noted in HPI.   Objective:  Physical Exam Blood pressure 128/75, pulse 80, height 5\' 10"  (1.778 m), weight 215 lb 3.2 oz (97.6 kg), last menstrual period 06/15/2022. VS reviewed, nursing note reviewed,  Constitutional: well developed, well nourished, no distress HEENT: normocephalic CV: normal rate Pulm/chest wall: normal effort Breast Exam: deferred Abdomen: soft Neuro: alert and oriented x 3 Skin: warm, dry Psych: affect normal Pelvic exam: Deferred  Assessment & Plan:  1. Uterine leiomyoma, unspecified location --Reviewed [place] results from 05/27/22 with 2.4 cm uterine fibroid. No other reasons for pelvic pain seen on [place].  2. Chronic pelvic pain in female --Pt pain is better managed than it was in January, but still having some days of severe pain.   --I discussed limited use of narcotics and pt reports she just used last of the 10 Norco  tablets so only used them occasionally.   --I will renew Norco for 10 additional tablets. Pt to try natural/herbal remedies/vitamins to regulate periods (see below) and f/u with me in 6 months  --Renewed Rx for Naproxen and Norco (10 tabs only) - HYDROcodone-acetaminophen (NORCO) 5-325 MG tablet; Take 1-2 tablets by mouth every 6 (six) hours as needed for moderate pain.  Dispense: 10 tablet; Refill: 0  3. [place] --Pain may be ovulatory in nature, occurs monthly around day 14 of cycle --Discussed use of hormones to prevent ovulation but pt is not interested in hormonal therapy at this time --Pt interested in natural remedies, vitamins, etc.so will send some information via Mychart for things to try safely.  4. Severe pain  - HYDROcodone-acetaminophen (NORCO) 5-325 MG tablet; Take 1-2 tablets by mouth every 6 (six) hours as needed for moderate pain.  Dispense: 10 tablet; Refill: 0    No follow-ups on file.   Fatima Blank, CNM 3:52 PM

## 2022-07-09 ENCOUNTER — Encounter: Payer: Self-pay | Admitting: Advanced Practice Midwife

## 2022-08-27 ENCOUNTER — Other Ambulatory Visit (HOSPITAL_BASED_OUTPATIENT_CLINIC_OR_DEPARTMENT_OTHER): Payer: Self-pay | Admitting: Advanced Practice Midwife

## 2022-08-27 ENCOUNTER — Other Ambulatory Visit: Payer: Self-pay | Admitting: Advanced Practice Midwife

## 2022-08-27 ENCOUNTER — Encounter: Payer: Self-pay | Admitting: Advanced Practice Midwife

## 2022-08-27 DIAGNOSIS — N940 Mittelschmerz: Secondary | ICD-10-CM

## 2022-08-27 DIAGNOSIS — R52 Pain, unspecified: Secondary | ICD-10-CM

## 2022-08-27 DIAGNOSIS — G8929 Other chronic pain: Secondary | ICD-10-CM

## 2022-08-27 MED ORDER — NAPROXEN SODIUM 550 MG PO TABS: 550.0000 mg | ORAL_TABLET | Freq: Two times a day (BID) | ORAL | 2 refills | Status: DC

## 2022-08-27 MED ORDER — HYDROCODONE-ACETAMINOPHEN 5-325 MG PO TABS: 1.0000 | ORAL_TABLET | Freq: Four times a day (QID) | ORAL | 0 refills | Status: DC | PRN

## 2022-08-27 NOTE — Progress Notes (Signed)
Pt called requesting refill of Naproxen and Norco for chronic menstrual pain.  Last Rx for Norco 07/08/22 with 10 tablets, pt using for menses only.  Rx renewed for 10 tablets.  Rx for Naproxen renewed as well.

## 2022-09-11 ENCOUNTER — Encounter (HOSPITAL_BASED_OUTPATIENT_CLINIC_OR_DEPARTMENT_OTHER): Payer: Self-pay | Admitting: Emergency Medicine

## 2022-09-11 ENCOUNTER — Emergency Department (HOSPITAL_BASED_OUTPATIENT_CLINIC_OR_DEPARTMENT_OTHER): Payer: Medicaid Other

## 2022-09-11 ENCOUNTER — Other Ambulatory Visit: Payer: Self-pay

## 2022-09-11 ENCOUNTER — Emergency Department (HOSPITAL_BASED_OUTPATIENT_CLINIC_OR_DEPARTMENT_OTHER)
Admission: EM | Admit: 2022-09-11 | Discharge: 2022-09-11 | Disposition: A | Discharge status: Home / Self Care | Payer: Medicaid Other | Attending: Emergency Medicine | Admitting: Emergency Medicine

## 2022-09-11 DIAGNOSIS — M79662 Pain in left lower leg: Secondary | ICD-10-CM | POA: Diagnosis present

## 2022-09-11 LAB — BASIC METABOLIC PANEL
Anion gap: 7 (ref 5–15)
BUN: 6 mg/dL (ref 6–20)
CO2: 28 mmol/L (ref 22–32)
Calcium: 9.5 mg/dL (ref 8.9–10.3)
Chloride: 105 mmol/L (ref 98–111)
Creatinine, Ser: 0.67 mg/dL (ref 0.44–1.00)
GFR, Estimated: >60 mL/min (ref >60)
Glucose, Bld: 92 mg/dL (ref 70–99)
Potassium: 3.6 mmol/L (ref 3.5–5.1)
Sodium: 140 mmol/L (ref 135–145)

## 2022-09-11 LAB — CBC
HCT: 34.5 % — ABNORMAL LOW (ref 36.0–46.0)
Hemoglobin: 11.9 g/dL — ABNORMAL LOW (ref 12.0–15.0)
MCH: 29 pg (ref 26.0–34.0)
MCHC: 34.5 g/dL (ref 30.0–36.0)
MCV: 83.9 fL (ref 80.0–100.0)
Platelets: 308 10*3/uL (ref 150–400)
RBC: 4.11 MIL/uL (ref 3.87–5.11)
RDW: 13.2 % (ref 11.5–15.5)
WBC: 8 10*3/uL (ref 4.0–10.5)
nRBC: 0 % (ref 0.0–0.2)

## 2022-09-11 MED ORDER — RIVAROXABAN (XARELTO) VTE STARTER PACK (15 & 20 MG)
ORAL_TABLET | ORAL | 0 refills | Status: DC
  Filled 2022-09-12: qty 51, 30d supply, fill #0

## 2022-09-11 NOTE — ED Provider Notes (Signed)
[place] EMERGENCY DEPARTMENT AT [facility] Provider Note   CSN: [id number] Arrival date & time: 09/11/22  1610     History  Chief Complaint  Patient presents with   Leg Pain    Caitlin Patrick is a 37 y.o. female.  37 y.o female with no PMH presents to the ED with a chief complaint of left calf pain x 5 days.  Patient reports she was on a plane on Friday, sat on the runway for approximately 2 hours, then her flight occurred, she was sitting down for a total of 4 hours.  She reports she took another flight on Monday, this lasted about an hour and a half with some layover.  She reports there is severe pain to her left calf, especially when she is ambulating or trying to weight-bear.  She did not suffer any trauma, she reports she is now walking with a limp.  She was previously prescribed Norco and naproxen in order to help with her menstrual cramps, reports she has taking these without much improvement in her symptoms.  She is concerned for a blood clot at this time.  She does not have any prior history of DVT or PE, no chest pain, no shortness of breath, not on any OCPs.  The history is provided by the patient and medical records.  Leg Pain Associated symptoms: no fever        Home Medications Prior to Admission medications   Medication Sig Start Date End Date Taking? Authorizing Provider  RIVAROXABAN Carlena Hurl) VTE STARTER PACK (15 & 20 MG) Follow package directions: Take one 15mg  tablet by mouth twice a day. On day 22, switch to one 20mg  tablet once a day. Take with food. 09/11/22  Yes Sari Cogan, Leonie Douglas, PA-C  HYDROcodone-acetaminophen (NORCO) 5-325 MG tablet Take 1-2 tablets by mouth every 6 (six) hours as needed for moderate pain. 08/27/22   Leftwich-Kirby, Wilmer Floor, CNM  loperamide (IMODIUM) 2 MG capsule Take 1 capsule (2 mg total) by mouth 4 (four) times daily as needed for diarrhea or loose stools. Patient not taking: Reported on 04/29/2022 08/01/21   Vanetta Mulders, MD   ondansetron (ZOFRAN-ODT) 4 MG disintegrating tablet Take 1 tablet (4 mg total) by mouth every 8 (eight) hours as needed for nausea or vomiting. Patient not taking: Reported on 04/29/2022 08/01/21   Vanetta Mulders, MD      Allergies    Patient has no known allergies.    Review of Systems   Review of Systems  Constitutional:  Negative for chills and fever.  Respiratory:  Negative for shortness of breath.   Cardiovascular:  Negative for chest pain and leg swelling.  Gastrointestinal:  Negative for abdominal pain.  Genitourinary:  Negative for flank pain.  Musculoskeletal:  Positive for myalgias.  All other systems reviewed and are negative.   Physical Exam Updated Vital Signs BP 125/88 (BP Location: Right Arm)   Pulse 74   Temp 99.2 F (37.3 C) (Oral)   Resp 18   Ht 5\' 10"  (1.778 m)   Wt 97.5 kg   LMP 09/07/2022 (Exact Date)   SpO2 98%   BMI 30.85 kg/m  Physical Exam Vitals and nursing note reviewed.  Constitutional:      Appearance: Normal appearance.  HENT:     Head: Normocephalic and atraumatic.     Mouth/Throat:     Mouth: Mucous membranes are moist.  Cardiovascular:     Rate and Rhythm: Normal rate.     Pulses:  Dorsalis pedis pulses are 2+ on the left side.       Posterior tibial pulses are 2+ on the left side.  Pulmonary:     Effort: Pulmonary effort is normal.  Abdominal:     General: Abdomen is flat.  Musculoskeletal:     Cervical back: Normal range of motion and neck supple.     Right lower leg: No edema.     Left lower leg: No edema.  Skin:    General: Skin is warm and dry.       Neurological:     Mental Status: She is alert and oriented to person, place, and time.     ED Results / Procedures / Treatments   Labs (all labs ordered are listed, but only abnormal results are displayed) Labs Reviewed  CBC - Abnormal; Notable for the following components:      Result Value   Hemoglobin 11.9 (*)    HCT 34.5 (*)    All other components  within normal limits  BASIC METABOLIC PANEL    EKG None  Radiology US Venous Img Lower Unilateral Left  Result Date: 09/11/2022 CLINICAL DATA:  Left calf pain status post flight EXAM: Left LOWER EXTREMITY VENOUS DOPPLER ULTRASOUND TECHNIQUE: Gray-scale sonography with compression, as well as color and duplex ultrasound, were performed to evaluate the deep venous system(s) from the level of the common femoral vein through the popliteal and proximal calf veins. COMPARISON:  None Available. FINDINGS: VENOUS Acute occlusive noncompressible thrombus in the left gastrocnemius veins. Normal compressibility of the common femoral, superficial femoral, and popliteal veins, as well as the posterior tibial and peroneal veins. Visualized portions of profunda femoral vein and great saphenous vein unremarkable. Doppler waveforms show normal direction of venous flow, normal respiratory plasticity and response to augmentation. Limited views of the contralateral common femoral vein are unremarkable. OTHER None. Limitations: none IMPRESSION: Acute DVT in the left gastrocnemius veins. Critical Value/emergent results were called by telephone at the time of interpretation on 09/11/2022 at 6:21 pm to provider [facility] , who verbally acknowledged these results. Electronically Signed   By: Minerva Fester M.D.   On: 09/11/2022 18:39    Procedures Procedures    Medications Ordered in ED Medications - No data to display  ED Course/ Medical Decision Making/ A&P Clinical Course as of 09/11/22 1846  Wed Sep 11, 2022  1830 Creatinine: 0.67 [JS]    Clinical Course User Index [JS] Claude Manges, PA-C                             Medical Decision Making Amount and/or Complexity of Data Reviewed Labs: ordered. Decision-making details documented in ED Course.  Risk Prescription drug management.    Patient presents to the ED with a chief complaint of left leg swelling that is been ongoing for the past 4 days.   Reports she was on a long flight of 4 hours, then flew back on Monday, has having pain with ambulation along with weightbearing.  She feels that she is somewhat limping.  Her left calf is significant tender to touch without any swelling noted.  She denies any pain or complaints to her right leg.  On palpation she has 2+ DP, PT pulses.  There is significant pain with palpation of the calf, no streaking in the skin, no redness to suggest infection such as cellulitis.  She is here without any chest pain, no shortness of breath, no  prior history of blood clots.  DVT study obtained which was positive for a DVT on her gastrocnemius: Acute DVT in the left gastrocnemius veins.    Critical Value/emergent results were called by telephone at the time  of interpretation on 09/11/2022 at 6:21 pm to provider [facility] ,  who verbally acknowledged these results.   Nothing visualized proximally, patient did have labs drawn on arrival, creatinine level is within normal limits.  She was started on Xarelto in order to help treatment of this.  We discussed risk factors, along with risks over benefits.  Patient is agreeable to follow-up with primary care physician.  Return precautions discussed at length, patient stable for discharge.   Portions of this note were generated with Scientist, clinical (histocompatibility and immunogenetics). Dictation errors may occur despite best attempts at proofreading.   Final Clinical Impression(s) / ED Diagnoses Final diagnoses:  Pain of left calf    Rx / DC Orders ED Discharge Orders          Ordered    RIVAROXABAN (XARELTO) VTE STARTER PACK (15 & 20 MG)        09/11/22 1843              Claude Manges, PA-C 09/11/22 1849    Gwyneth Sprout, MD 09/12/22 [id number]

## 2022-09-11 NOTE — ED Triage Notes (Signed)
Pt via pov from home with left calf pain since Friday. Pt reports she was on an airplane when it started and the pain has increased significantly. Pt alert & oriented, nad noted.

## 2022-09-11 NOTE — Discharge Instructions (Addendum)
Rivaroxaban (Xarelto) ED Discharge Instructions  ° °Patient received a prescription for  °Xarelto 15 & 20 mg - 51 tablet VTE STARTER PACK. °  °Patient understands only the FIRST 30 DAYS OF TREATMENT  °will be provided by the starter pack. °  °Patient understands to contact primary care doctor or ED immediately if for any reason is unable to fill the starter pack prescription. ° °Patient must schedule a follow-up appointment with primary care doctor within 15 days of discharge in order to receive the maintenance prescription and clinical follow up. ° °Patient has received an education kit containing ([facility] Trial Offer Card, DVT/PE brochure, Dosing Diary, and Xarelto Medication Guide).  ° °If not performed in the ED, patient will receive medication counseling by a [facility] pharmacist via phone follow-up within the next 72 hours. Pharmacist to review signs and symptoms of bleeding and proper use of this medication.  ° °Call 911 or return immediately to the nearest ED if you develop bleeding (e.g. nose, gums, vomit, urine, bloody or dark stools), unusual bruising, head trauma (even if minor), severe headache, altered mental status, change in speech, weakness on one side of body, shortness of breath, swollen lips/tongue/face/neck, chest pain, or other concerns. °   °Information on my medicine - XARELTO® (rivaroxaban)  °This medication education was provided to me or my healthcare representative as part of my discharge instructions. °  °WHY WAS XARELTO® PRESCRIBED FOR YOU?  °Xarelto® was prescribed to treat blood clots that may have been found in the veins of your legs (deep vein thrombosis) or in your lungs (pulmonary embolism) and to reduce the risk of them occurring again. °  °WHAT DO YOU NEED TO KNOW ABOUT XARELTO®?  °The starting dose is one 15 mg tablet taken TWICE daily with food for the FIRST 21 DAYS then on day 22 the dose is changed to one 20 mg tablet taken ONCE A DAY with your evening meal. °  °DO NOT  stop taking Xarelto® without talking to the health care provider who prescribed the medication. Refill your prescription for 20 mg tablets before you run out.  °After discharge, you should have regular check-up appointments with your healthcare provider that is prescribing your Xarelto®. In the future your dose may need to be changed if your kidney function changes by a significant amount.  ° °WHAT DO YOU DO IF YOU MISS A DOSE?  °If you are taking Xarelto® TWICE DAILY and you miss a dose, take it as soon as you remember. You may take two 15 mg tablets (total 30 mg) at the same time then resume your regularly scheduled 15 mg twice daily the next day. °  °If you are taking Xarelto® ONCE DAILY and you miss a dose, take it as soon as you remember on the same day then continue your regularly scheduled once daily regimen the next day. Do not take two doses of Xarelto® at the same time. °  °IMPORTANT SAFETY INFORMATION  °Xarelto® is a blood thinner medicine that can cause bleeding. You should call your healthcare provider right away if you experience any of the following:  °-  Bleeding from an injury or your nose that does not stop.  °-  Unusual colored urine (red or dark brown) or unusual colored stools (red or black).  °-  Unusual bruising for unknown reasons.  °-  A serious fall or if you hit your head (even if there is no bleeding).  ° °Some medicines may interact with Xarelto® and   might increase your risk of bleeding while on Xarelto®. To help avoid this, consult your healthcare provider or pharmacist prior to using any new prescription or non-prescription medications, including herbals, vitamins, non-steroidal anti-inflammatory drugs (NSAIDs) and supplements.  ° °This website has more information on Xarelto®: www.xarelto.com. °

## 2022-09-11 NOTE — ED Notes (Signed)
RN reviewed discharge instructions with pt. Pt verbalized understanding and had no further questions. VSS upon discharge.  

## 2022-09-12 ENCOUNTER — Other Ambulatory Visit (HOSPITAL_BASED_OUTPATIENT_CLINIC_OR_DEPARTMENT_OTHER): Payer: Self-pay

## 2022-09-16 ENCOUNTER — Emergency Department (HOSPITAL_BASED_OUTPATIENT_CLINIC_OR_DEPARTMENT_OTHER)
Admission: EM | Admit: 2022-09-16 | Discharge: 2022-09-16 | Disposition: A | Discharge status: Home / Self Care | Payer: Medicaid Other | Attending: Emergency Medicine | Admitting: Emergency Medicine

## 2022-09-16 ENCOUNTER — Encounter (HOSPITAL_BASED_OUTPATIENT_CLINIC_OR_DEPARTMENT_OTHER): Payer: Self-pay | Admitting: Emergency Medicine

## 2022-09-16 ENCOUNTER — Emergency Department (HOSPITAL_BASED_OUTPATIENT_CLINIC_OR_DEPARTMENT_OTHER): Payer: Medicaid Other

## 2022-09-16 ENCOUNTER — Other Ambulatory Visit: Payer: Self-pay

## 2022-09-16 DIAGNOSIS — R911 Solitary pulmonary nodule: Secondary | ICD-10-CM | POA: Diagnosis not present

## 2022-09-16 DIAGNOSIS — M79605 Pain in left leg: Secondary | ICD-10-CM | POA: Diagnosis not present

## 2022-09-16 DIAGNOSIS — Z87891 Personal history of nicotine dependence: Secondary | ICD-10-CM | POA: Insufficient documentation

## 2022-09-16 DIAGNOSIS — R42 Dizziness and giddiness: Secondary | ICD-10-CM | POA: Diagnosis not present

## 2022-09-16 DIAGNOSIS — R0602 Shortness of breath: Secondary | ICD-10-CM

## 2022-09-16 DIAGNOSIS — Z7901 Long term (current) use of anticoagulants: Secondary | ICD-10-CM | POA: Diagnosis not present

## 2022-09-16 HISTORY — DX: Acute embolism and thrombosis of unspecified deep veins of unspecified lower extremity: I82.409

## 2022-09-16 LAB — CBC
HCT: 34.7 % — ABNORMAL LOW (ref 36.0–46.0)
Hemoglobin: 11.9 g/dL — ABNORMAL LOW (ref 12.0–15.0)
MCH: 28.9 pg (ref 26.0–34.0)
MCHC: 34.3 g/dL (ref 30.0–36.0)
MCV: 84.2 fL (ref 80.0–100.0)
Platelets: 304 10*3/uL (ref 150–400)
RBC: 4.12 MIL/uL (ref 3.87–5.11)
RDW: 13.1 % (ref 11.5–15.5)
WBC: 6.6 10*3/uL (ref 4.0–10.5)
nRBC: 0 % (ref 0.0–0.2)

## 2022-09-16 LAB — BASIC METABOLIC PANEL
Anion gap: 6 (ref 5–15)
BUN: 6 mg/dL (ref 6–20)
CO2: 29 mmol/L (ref 22–32)
Calcium: 9.2 mg/dL (ref 8.9–10.3)
Chloride: 104 mmol/L (ref 98–111)
Creatinine, Ser: 0.65 mg/dL (ref 0.44–1.00)
GFR, Estimated: >60 mL/min (ref >60)
Glucose, Bld: 81 mg/dL (ref 70–99)
Potassium: 3.9 mmol/L (ref 3.5–5.1)
Sodium: 139 mmol/L (ref 135–145)

## 2022-09-16 LAB — TROPONIN I (HIGH SENSITIVITY): Troponin I (High Sensitivity): 2 ng/L (ref <18)

## 2022-09-16 MED ORDER — IOHEXOL 350 MG/ML SOLN
100.0000 mL | Freq: Once | INTRAVENOUS | Status: AC | PRN
  Administered 2022-09-16: 75 mL via INTRAVENOUS

## 2022-09-16 NOTE — ED Notes (Signed)
Dc instructions reviewed with patient. Patient voiced understanding. Dc with belongings.  °

## 2022-09-16 NOTE — ED Provider Notes (Signed)
[place] EMERGENCY DEPARTMENT AT [facility] Provider Note   CSN: [id number] Arrival date & time: 09/16/22  1151     History  No chief complaint on file.   Caitlin Patrick is a 37 y.o. female.  Patient presents the emergency department today for evaluation of shortness of breath.  Patient was seen in the emergency department on 5/20 05/2022.  She was seen for left lower extremity pain and swelling.  She was diagnosed with DVT and started on Xarelto.  She has been taking her medications.  Today she was walking outdoors and felt much more short of breath than she typically does.  This was concerning for PE so she sought medical treatment.  At rest she feels normal.  She denies any chest pain.  No lightheadedness or syncope.  No cough or fevers.  She does state that since starting to take the medication she has had some dizziness but the pain in her left leg has been steadily improving and she is able to bear weight without as much pain now.  The dizziness is described as a spinning sensation.  She has not had any lightheadedness or syncope.  She has had some intermittent headaches but not severe and all of these have resolved. Patient denies signs of stroke including: facial droop, slurred speech, aphasia, weakness/numbness in extremities, imbalance/trouble walking.        Home Medications Prior to Admission medications   Medication Sig Start Date End Date Taking? Authorizing Provider  HYDROcodone-acetaminophen (NORCO) 5-325 MG tablet Take 1-2 tablets by mouth every 6 (six) hours as needed for moderate pain. 08/27/22   Leftwich-Kirby, Wilmer Floor, CNM  loperamide (IMODIUM) 2 MG capsule Take 1 capsule (2 mg total) by mouth 4 (four) times daily as needed for diarrhea or loose stools. Patient not taking: Reported on 04/29/2022 08/01/21   Vanetta Mulders, MD  ondansetron (ZOFRAN-ODT) 4 MG disintegrating tablet Take 1 tablet (4 mg total) by mouth every 8 (eight) hours as needed for nausea or  vomiting. Patient not taking: Reported on 04/29/2022 08/01/21   Vanetta Mulders, MD  RIVAROXABAN Carlena Hurl) VTE STARTER PACK (15 & 20 MG) Follow package directions: Take one 15mg  tablet by mouth twice a day. On day 22, switch to one 20mg  tablet once a day. Take with food. 09/11/22   Claude Manges, PA-C      Allergies    Patient has no known allergies.    Review of Systems   Review of Systems  Physical Exam Updated Vital Signs BP 117/80   Pulse 94   Temp 98.4 F (36.9 C) (Oral)   Resp (!) 21   LMP 09/07/2022 (Exact Date)   SpO2 100%  Physical Exam Vitals and nursing note reviewed.  Constitutional:      General: She is not in acute distress.    Appearance: She is well-developed.  HENT:     Head: Normocephalic and atraumatic.     Right Ear: External ear normal.     Left Ear: External ear normal.     Nose: Nose normal.  Eyes:     Conjunctiva/sclera: Conjunctivae normal.  Cardiovascular:     Rate and Rhythm: Normal rate and regular rhythm.     Heart sounds: No murmur heard. Pulmonary:     Effort: No respiratory distress.     Breath sounds: No wheezing, rhonchi or rales.  Abdominal:     Palpations: Abdomen is soft.     Tenderness: There is no abdominal tenderness. There is no guarding  or rebound.  Musculoskeletal:     Cervical back: Normal range of motion and neck supple.     Right lower leg: No edema.     Left lower leg: No edema.     Comments: Mild left calf tenderness  Skin:    General: Skin is warm and dry.     Findings: No rash.  Neurological:     General: No focal deficit present.     Mental Status: She is alert. Mental status is at baseline.     Motor: No weakness.  Psychiatric:        Mood and Affect: Mood normal.     ED Results / Procedures / Treatments   Labs (all labs ordered are listed, but only abnormal results are displayed) Labs Reviewed  CBC - Abnormal; Notable for the following components:      Result Value   Hemoglobin 11.9 (*)    HCT 34.7 (*)     All other components within normal limits  BASIC METABOLIC PANEL  TROPONIN I (HIGH SENSITIVITY)    EKG EKG Interpretation  Date/Time:  Monday Sep 16 2022 12:06:53 EDT Ventricular Rate:  81 PR Interval:  136 QRS Duration: 84 QT Interval:  353 QTC Calculation: 410 R Axis:   50 Text Interpretation: Sinus rhythm ST elev, probable normal early repol pattern agree. no old comparison Confirmed by Arby Barrette [phone]) on 09/16/2022 12:09:46 PM  Radiology CT Angio Chest PE W and/or Wo Contrast  Result Date: 09/16/2022 CLINICAL DATA:  PE suspected. EXAM: CT ANGIOGRAPHY CHEST WITH CONTRAST TECHNIQUE: Multidetector CT imaging of the chest was performed using the standard protocol during bolus administration of intravenous contrast. Multiplanar CT image reconstructions and MIPs were obtained to evaluate the vascular anatomy. RADIATION DOSE REDUCTION: This exam was performed according to the departmental dose-optimization program which includes automated exposure control, adjustment of the mA and/or kV according to patient size and/or use of iterative reconstruction technique. CONTRAST:  75mL OMNIPAQUE IOHEXOL 350 MG/ML SOLN COMPARISON:  October 15, 2009 FINDINGS: Cardiovascular: Satisfactory opacification of the pulmonary arteries to the segmental level. No evidence of pulmonary embolism. Normal heart size. No pericardial effusion. Mediastinum/Nodes: No enlarged mediastinal, hilar, or axillary lymph nodes. Thyroid gland, trachea, and esophagus demonstrate no significant findings. Lungs/Pleura: 6 mm right middle lobe solid subpleural nodule. Image 65/145, series 6. 3 mm ground-glass nodule in the right middle lobe, image 56/145, series 6. no pleural effusion or lobar consolidation. Upper Abdomen: No acute abnormality. Musculoskeletal: No chest wall abnormality. No acute or significant osseous findings. Review of the MIP images confirms the above findings. IMPRESSION: 1. No evidence of pulmonary embolus. 2. 6  mm right middle lobe solid subpleural nodule. Non-contrast chest CT at 6-12 months is recommended. If the nodule is stable at time of repeat CT, then future CT at 18-24 months (from today's scan) is considered optional for low-risk patients, but is recommended for high-risk patients. This recommendation follows the consensus statement: Guidelines for Management of Incidental Pulmonary Nodules Detected on CT Images: From the Fleischner Society 2017; Radiology 2017; 284:228-243. 3. 3 mm ground-glass nodule in the right middle lobe. Electronically Signed   By: Ted Mcalpine M.D.   On: 09/16/2022 15:37    Procedures Procedures    Medications Ordered in ED Medications  iohexol (OMNIPAQUE) 350 MG/ML injection 100 mL (75 mLs Intravenous Contrast Given 09/16/22 1505)    ED Course/ Medical Decision Making/ A&P    Patient seen and examined. History obtained directly from patient.  Labs/EKG: Ordered CBC, BMP, troponin.  Imaging: Ordered CT angio of the chest.  Medications/Fluids: None ordered  Most recent vital signs reviewed and are as follows: BP 117/80   Pulse 94   Temp 98.4 F (36.9 C) (Oral)   Resp (!) 21   LMP 09/07/2022 (Exact Date)   SpO2 100%   Initial impression: Exertional shortness of breath in setting of recent diagnosis of DVT.  Patient has been compliant with her Xarelto.  Her vital signs here are reassuring.  We discussed possible options of continued monitoring with treatment versus evaluation for PE today with CT angiography.  Patient would like to proceed with CT imaging.  4:18 PM Reassessment performed. Patient appears stable.  Vital signs remain normal with normal heart rate and oxygen saturation 100% on room air.  Labs personally reviewed and interpreted including: CBC with normal white blood cell count, low normal hemoglobin at 11.9; BMP unremarkable with normal kidney function; troponin normal.  Imaging personally visualized and interpreted including: CT angio  of the chest, agree no PE.  Reviewed pertinent lab work and imaging with patient at bedside. Questions answered.  Patient notified of pulmonary nodule and recommendation for recheck in 6 to 12 months.  Patient is a former smoker.  Most current vital signs reviewed and are as follows: BP 113/78   Pulse 70   Temp 98.4 F (36.9 C) (Oral)   Resp 16   LMP 09/07/2022 (Exact Date)   SpO2 100%   Plan: Discharge to home.   Prescriptions written for: None  Other home care instructions discussed: Compliance with anticoagulation  ED return instructions discussed: Return with worsening shortness of breath or trouble breathing, persistent chest pain, severe headache or persistent symptoms which would be concerning for stroke. Patient counseled to return if they have weakness in their arms or legs, slurred speech, trouble walking or talking, confusion, trouble with their balance, or if they have any other concerns.   Follow-up instructions discussed: Patient encouraged to follow-up with their PCP in 7 days.                             Medical Decision Making Amount and/or Complexity of Data Reviewed Labs: ordered. Radiology: ordered.  Risk Prescription drug management.   Patient with episode of shortness of breath today in setting of recently diagnosed DVT.  Patient has been compliant with her anticoagulation.  CT of the chest negative for PE or other concerning problems.  Troponin is normal and EKG is nonischemic.  Patient has had some dizziness but has a reassuring neuroexam here and no persistent severe headache.  Low concern for intracranial hemorrhage at this time.  The patient's vital signs, pertinent lab work and imaging were reviewed and interpreted as discussed in the ED course. Hospitalization was considered for further testing, treatments, or serial exams/observation. However as patient is well-appearing, has a stable exam, and reassuring studies today, I do not feel that they warrant  admission at this time. This plan was discussed with the patient who verbalizes agreement and comfort with this plan and seems reliable and able to return to the Emergency Department with worsening or changing symptoms.          Final Clinical Impression(s) / ED Diagnoses Final diagnoses:  Shortness of breath  Dizziness  Pulmonary nodule    Rx / DC Orders ED Discharge Orders     None         Renne Crigler,  PA-C 09/16/22 1621    Arby Barrette, MD 09/17/22 [phone]

## 2022-09-16 NOTE — ED Triage Notes (Signed)
Pt has known DVT, is taking xarleto. She states the pain in her calf has not abated ,she became short of breath when walking today

## 2022-09-16 NOTE — Discharge Instructions (Signed)
Please read and follow all provided instructions.  Your diagnoses today include:  1. Shortness of breath   2. Dizziness   3. Pulmonary nodule    Tests performed today include: Blood cell counts and electrolytes: No significant problems noted Blood test to screen for stress on heart: Was normal EKG: No signs of abnormal heart rhythms CT of your chest: Showed a small pulmonary nodule which will need to be rechecked in 6 to 12 months, otherwise no signs of blood clots in the lungs, pneumonia, fluid buildup or other problems Vital signs. See below for your results today.   Medications prescribed:   Take any prescribed medications only as directed.  Home care instructions:  Follow any educational materials contained in this packet.  BE VERY CAREFUL not to take multiple medicines containing Tylenol (also called acetaminophen). Doing so can lead to an overdose which can damage your liver and cause liver failure and possibly death.   Follow-up instructions: Please follow-up with your primary care provider in the next 3 days for further evaluation of your symptoms.   Return instructions:  Please return to the Emergency Department if you experience worsening symptoms.  Please return if you have any other emergent concerns.  Additional Information:  Your vital signs today were: BP 113/78   Pulse 70   Temp 98.4 F (36.9 C) (Oral)   Resp 16   LMP 09/07/2022 (Exact Date)   SpO2 100%  If your blood pressure (BP) was elevated above 135/85 this visit, please have this repeated by your doctor within one month. --------------

## 2022-10-07 ENCOUNTER — Encounter (HOSPITAL_BASED_OUTPATIENT_CLINIC_OR_DEPARTMENT_OTHER): Payer: Self-pay | Admitting: Family Medicine

## 2022-10-07 ENCOUNTER — Ambulatory Visit (HOSPITAL_BASED_OUTPATIENT_CLINIC_OR_DEPARTMENT_OTHER): Payer: No Typology Code available for payment source | Admitting: Family Medicine

## 2022-10-07 VITALS — BP 123/87 | HR 92 | Ht 70.0 in | Wt 215.0 lb

## 2022-10-07 DIAGNOSIS — I82409 Acute embolism and thrombosis of unspecified deep veins of unspecified lower extremity: Secondary | ICD-10-CM | POA: Insufficient documentation

## 2022-10-07 DIAGNOSIS — I824Z9 Acute embolism and thrombosis of unspecified deep veins of unspecified distal lower extremity: Secondary | ICD-10-CM

## 2022-10-07 DIAGNOSIS — D649 Anemia, unspecified: Secondary | ICD-10-CM | POA: Diagnosis not present

## 2022-10-07 DIAGNOSIS — R911 Solitary pulmonary nodule: Secondary | ICD-10-CM

## 2022-10-07 MED ORDER — RIVAROXABAN 20 MG PO TABS: 20.0000 mg | ORAL_TABLET | Freq: Every day | ORAL | 0 refills | Status: DC

## 2022-10-07 NOTE — Progress Notes (Signed)
New Patient Office Visit  Subjective    Patient ID: Caitlin Patrick, female    DOB: May 13, 1985  Age: 37 y.o. MRN: [id number]  CC:  Chief Complaint  Patient presents with   New Patient (Initial Visit)    Pt is here today to establish care with new PCP. Is currently on Xarelto after having recent blood clot.    HPI Caitlin Patrick presents to establish care Last PCP -none recently.  Recent diagnosis of DVT: Diagnosed about 3 to 4 weeks ago.  Patient had presented to emergency department with pain in left calf.  Ultrasound did show DVT.  Patient was started on Xarelto, she has nearly completed starter pack, is needing refill of medication at this time.  Clot was felt to be related to air travel.  She had been sitting on running for a long period time on the plane and then also had the flight itself.  No other issues in the past related to blood clots.  Denies any family history of clotting or bleeding disorders that she is aware of.  Pulmonary nodule: Patient was subsequently seen in the emergency department following initial diagnosis of DVT related to shortness of breath.  Due to known DVT and new symptoms, patient had CT angio completed which did not show evidence of pulmonary embolism, however it was noted that she had a 6 mm right middle lobe solid subpleural nodule.  Recommendation from radiology was to repeat noncontrast CT chest in 6 to 12 months for monitoring.  Otherwise, patient denies any concerns today.  She did recently start menstrual period and has noted slightly more bleeding than typical for her.  She typically does not have significant symptoms related to heavy menstrual bleeding during her cycles.  Currently no significant symptoms such as lightheadedness or dizziness.  Patient is originally from [place], [place], has lived here for 22 years. Works as a Financial trader. She enjoys hiking, being outdoors, crafting.  Outpatient Encounter Medications as of 10/07/2022   Medication Sig   HYDROcodone-acetaminophen (NORCO) 5-325 MG tablet Take 1-2 tablets by mouth every 6 (six) hours as needed for moderate pain.   loperamide (IMODIUM) 2 MG capsule Take 1 capsule (2 mg total) by mouth 4 (four) times daily as needed for diarrhea or loose stools.   ondansetron (ZOFRAN-ODT) 4 MG disintegrating tablet Take 1 tablet (4 mg total) by mouth every 8 (eight) hours as needed for nausea or vomiting.   rivaroxaban (XARELTO) 20 MG TABS tablet Take 1 tablet (20 mg total) by mouth daily with supper. Please give 15 mg tabs QS for use BID x 21 days, then 20 mg PO daily.   [DISCONTINUED] RIVAROXABAN (XARELTO) VTE STARTER PACK (15 & 20 MG) Follow package directions: Take one 15mg  tablet by mouth twice a day. On day 22, switch to one 20mg  tablet once a day. Take with food.   No facility-administered encounter medications on file as of 10/07/2022.    Past Medical History:  Diagnosis Date   DVT (deep venous thrombosis) (HCC)    Sickle cell trait (HCC)    Uterine leiomyoma 04/29/2022   Vaginal Pap smear, abnormal     Past Surgical History:  Procedure Laterality Date   CARPAL TUNNEL RELEASE Right 05/01/2021   Procedure: RIGHT CARPAL TUNNEL RELEASE;  Surgeon: Betha Loa, MD;  Location: [facility];  Service: Orthopedics;  Laterality: Right;   CESAREAN SECTION     MULTIPLE TOOTH EXTRACTIONS Left    WISDOM TOOTH EXTRACTION  Family History  Problem Relation Age of Onset   Breast cancer Mother    Cancer Mother    Hypertension Father    Diabetes Maternal Grandmother    Diabetes Paternal Grandmother     Social History   Socioeconomic History   Marital status: Single    Spouse name: Not on file   Number of children: Not on file   Years of education: Not on file   Highest education level: Not on file  Occupational History   Not on file  Tobacco Use   Smoking status: Former   Smokeless tobacco: Never  Vaping Use   Vaping Use: Never used  Substance  and Sexual Activity   Alcohol use: Not Currently    Comment: none   Drug use: Not Currently    Types: Marijuana    Comment: occasionally   Sexual activity: Not Currently    Partners: Female    Birth control/protection: None  Other Topics Concern   Not on file  Social History Narrative   Not on file   Social Determinants of Health   Financial Resource Strain: Low Risk  (10/07/2022)   Overall Financial Resource Strain (CARDIA)    Difficulty of Paying Living Expenses: Not hard at all  Food Insecurity: No Food Insecurity (10/07/2022)   Hunger Vital Sign    Worried About Running Out of Food in the Last Year: Never true    Ran Out of Food in the Last Year: Never true  Transportation Needs: No Transportation Needs (10/07/2022)   PRAPARE - Administrator, Civil Service (Medical): No    Lack of Transportation (Non-Medical): No  Physical Activity: Insufficiently Active (10/07/2022)   Exercise Vital Sign    Days of Exercise per Week: 1 day    Minutes of Exercise per Session: 60 min  Stress: No Stress Concern Present (10/07/2022)   [employer] of Occupational Health - Occupational Stress Questionnaire    Feeling of Stress : Not at all  Social Connections: Not on file  Intimate Partner Violence: Not At Risk (10/07/2022)   Humiliation, Afraid, Rape, and Kick questionnaire    Fear of Current or Ex-Partner: No    Emotionally Abused: No    Physically Abused: No    Sexually Abused: No    Objective    BP 123/87 (BP Location: Right Arm, Patient Position: Sitting, Cuff Size: Normal)   Pulse 92   Ht 5\' 10"  (1.778 m)   Wt 215 lb (97.5 kg)   LMP 10/04/2022 (Exact Date)   SpO2 100%   BMI 30.85 kg/m   Physical Exam  37 year old female in no acute distress Cardiovascular exam with regular rate and rhythm, no murmur appreciated Lungs clear to auscultation bilaterally  Assessment & Plan:   Anemia, unspecified type Assessment & Plan: Mild anemia observed on labs in the  emergency department.  Given that she is currently taking blood thinner and insetting of menstrual period, we will assess for any worsening of anemia, labs to be completed today.  We will also assess iron storage  Orders: -     CBC with Differential/Platelet -     Iron, TIBC and Ferritin Panel  Acute deep vein thrombosis (DVT) of distal vein of lower extremity, unspecified laterality (HCC) Assessment & Plan: Recent diagnosis, no prior clots reported, would be considered provoked due to air travel, prolonged period of immobility.  As result, would proceed with treatment for 3 months utilizing Xarelto.  Patient has been tolerating medication without issue,  needing refill today, refill sent to pharmacy on file.  Orders: -     Rivaroxaban; Take 1 tablet (20 mg total) by mouth daily with supper. Please give 15 mg tabs QS for use BID x 21 days, then 20 mg PO daily.  Dispense: 60 tablet; Refill: 0  Pulmonary nodule Assessment & Plan: Incidental finding on CT angiogram.  Recommendation is to repeat noncontrast CT scan in 6 to 12 months, we will proceed accordingly   Return in about 2 months (around 12/07/2022) for med check, DVT.    ___________________________________________ Tikisha Molinaro de [place], MD, ABFM, CAQSM Primary Care and Sports Medicine [facility]

## 2022-10-07 NOTE — Patient Instructions (Signed)
  Medication Instructions:  Your physician recommends that you continue on your current medications as directed. Please refer to the Current Medication list given to you today. --If you need a refill on any your medications before your next appointment, please call your pharmacy first. If no refills are authorized on file call the office.-- Lab Work: Your physician has recommended that you have lab work today: Yes If you have labs (blood work) drawn today and your tests are completely normal, you will receive your results via MyChart message OR a phone call from our staff.  Please ensure you check your voicemail in the event that you authorized detailed messages to be left on a delegated number. If you have any lab test that is abnormal or we need to change your treatment, we will call you to review the results.  Referrals/Procedures/Imaging: No  Follow-Up: Your next appointment:   Your physician recommends that you schedule a follow-up appointment in 2 months with Dr. de Cuba.  You will receive a text message or e-mail with a link to a survey about your care and experience with us today! We would greatly appreciate your feedback!   Thanks for letting us be apart of your health journey!!  Primary Care and Sports Medicine   Dr. Raymond de Cuba   We encourage you to activate your patient portal called "MyChart".  Sign up information is provided on this After Visit Summary.  MyChart is used to connect with patients for Virtual Visits (Telemedicine).  Patients are able to view lab/test results, encounter notes, upcoming appointments, etc.  Non-urgent messages can be sent to your provider as well. To learn more about what you can do with MyChart, please visit --  [email or web address].    

## 2022-10-08 ENCOUNTER — Telehealth (HOSPITAL_BASED_OUTPATIENT_CLINIC_OR_DEPARTMENT_OTHER): Payer: Self-pay | Admitting: Family Medicine

## 2022-10-08 ENCOUNTER — Telehealth: Payer: Medicaid Other | Admitting: Family Medicine

## 2022-10-08 ENCOUNTER — Encounter (HOSPITAL_BASED_OUTPATIENT_CLINIC_OR_DEPARTMENT_OTHER): Payer: Self-pay | Admitting: Family Medicine

## 2022-10-08 DIAGNOSIS — N920 Excessive and frequent menstruation with regular cycle: Secondary | ICD-10-CM

## 2022-10-08 LAB — CBC WITH DIFFERENTIAL/PLATELET
Basophils Absolute: 0 10*3/uL (ref 0.0–0.2)
Basos: 0 %
EOS (ABSOLUTE): 0.1 10*3/uL (ref 0.0–0.4)
Eos: 2 %
Hematocrit: 37.4 % (ref 34.0–46.6)
Hemoglobin: 12.4 g/dL (ref 11.1–15.9)
Immature Grans (Abs): 0 10*3/uL (ref 0.0–0.1)
Immature Granulocytes: 0 %
Lymphocytes Absolute: 2.7 10*3/uL (ref 0.7–3.1)
Lymphs: 39 %
MCH: 28.7 pg (ref 26.6–33.0)
MCHC: 33.2 g/dL (ref 31.5–35.7)
MCV: 87 fL (ref 79–97)
Monocytes Absolute: 0.6 10*3/uL (ref 0.1–0.9)
Monocytes: 8 %
Neutrophils Absolute: 3.5 10*3/uL (ref 1.4–7.0)
Neutrophils: 51 %
Platelets: 341 10*3/uL (ref 150–450)
RBC: 4.32 x10E6/uL (ref 3.77–5.28)
RDW: 13 % (ref 11.7–15.4)
WBC: 6.9 10*3/uL (ref 3.4–10.8)

## 2022-10-08 LAB — IRON,TIBC AND FERRITIN PANEL
Ferritin: 51 ng/mL (ref 15–150)
Iron Saturation: 16 % (ref 15–55)
Iron: 52 ug/dL (ref 27–159)
Total Iron Binding Capacity: 326 ug/dL (ref 250–450)
UIBC: 274 ug/dL (ref 131–425)

## 2022-10-08 NOTE — Progress Notes (Signed)
Virtual Visit Consent   Caitlin Patrick, you are scheduled for a virtual visit with a [facility] provider today. Just as with appointments in the office, your consent must be obtained to participate. Your consent will be active for this visit and any virtual visit you may have with one of our providers in the next 365 days. If you have a MyChart account, a copy of this consent can be sent to you electronically.  As this is a virtual visit, video technology does not allow for your provider to perform a traditional examination. This may limit your provider's ability to fully assess your condition. If your provider identifies any concerns that need to be evaluated in person or the need to arrange testing (such as labs, EKG, etc.), we will make arrangements to do so. Although advances in technology are sophisticated, we cannot ensure that it will always work on either your end or our end. If the connection with a video visit is poor, the visit may have to be switched to a telephone visit. With either a video or telephone visit, we are not always able to ensure that we have a secure connection.  By engaging in this virtual visit, you consent to the provision of healthcare and authorize for your insurance to be billed (if applicable) for the services provided during this visit. Depending on your insurance coverage, you may receive a charge related to this service.  I need to obtain your verbal consent now. Are you willing to proceed with your visit today? [name] has provided verbal consent on 10/08/2022 for a virtual visit (video or telephone). Freddy Finner, NP  Date: 10/08/2022 12:17 PM  Virtual Visit via Video Note   I, Freddy Finner, connected with  Caitlin Patrick  ([id number], [birth year]) on 10/08/22 at 12:15 PM EDT by a video-enabled telemedicine application and verified that I am speaking with the correct person using two identifiers.  Location: Patient: Virtual Visit Location  Patient: Home Provider: Virtual Visit Location Provider: Home Office   I discussed the limitations of evaluation and management by telemedicine and the availability of in person appointments. The patient expressed understanding and agreed to proceed.    History of Present Illness: Caitlin Patrick is a 37 y.o. who identifies as a female who was assigned female at birth, and is being seen today for heavy bleeding.  On Xarelto-post DVT reports cycle started 10/04/2022 and is having heavy menstrual cycle with large clots. Does have heavy cycles but does not have large clots or feel weak and dizzy normally. New to area just established care- feeling faint, and weak- needs in person eval for possible labs      Problems:  Patient Active Problem List   Diagnosis Date Noted   Anemia 10/07/2022   DVT (deep venous thrombosis) (HCC) 10/07/2022   Pulmonary nodule 10/07/2022   Mass of lower outer quadrant of left breast 04/29/2022   Chronic pelvic pain in female 04/29/2022   Uterine leiomyoma 04/29/2022   Family history of diabetes mellitus (DM) 04/23/2013   BMI 31.0-31.9,adult 04/23/2013    Allergies: No Known Allergies Medications:  Current Outpatient Medications:    HYDROcodone-acetaminophen (NORCO) 5-325 MG tablet, Take 1-2 tablets by mouth every 6 (six) hours as needed for moderate pain., Disp: 10 tablet, Rfl: 0   loperamide (IMODIUM) 2 MG capsule, Take 1 capsule (2 mg total) by mouth 4 (four) times daily as needed for diarrhea or loose stools., Disp: 12 capsule, Rfl:  0   ondansetron (ZOFRAN-ODT) 4 MG disintegrating tablet, Take 1 tablet (4 mg total) by mouth every 8 (eight) hours as needed for nausea or vomiting., Disp: 12 tablet, Rfl: 0   rivaroxaban (XARELTO) 20 MG TABS tablet, Take 1 tablet (20 mg total) by mouth daily with supper. Please give 15 mg tabs QS for use BID x 21 days, then 20 mg PO daily., Disp: 60 tablet, Rfl: 0  Observations/Objective: Patient is well-developed,  well-nourished in no acute distress.  Resting comfortably  at home.  Head is normocephalic, atraumatic.  No labored breathing.  Speech is clear and coherent with logical content.  Patient is alert and oriented at baseline.    Assessment and Plan:  1. Menorrhagia with regular cycle  On Xarelto- needs labs with symptomatic signs  In person is advised   Patient acknowledged agreement and understanding of the plan.     Follow Up Instructions: I discussed the assessment and treatment plan with the patient. The patient was provided an opportunity to ask questions and all were answered. The patient agreed with the plan and demonstrated an understanding of the instructions.  A copy of instructions were sent to the patient via MyChart unless otherwise noted below.     The patient was advised to call back or seek an in-person evaluation if the symptoms worsen or if the condition fails to improve as anticipated.  Time:  I spent 4 minutes with the patient via telehealth technology discussing the above problems/concerns.    Freddy Finner, NP

## 2022-10-08 NOTE — Telephone Encounter (Signed)
Patient called stating she is starting to clot and Dr. Ihor Dow asked her yesterday at appointment but now its alarming and really big clots . She has a scheduled appointment in August but does she need to be seen?

## 2022-10-08 NOTE — Patient Instructions (Signed)
  Mittie Bodo, thank you for joining Freddy Finner, NP for today's virtual visit.  While this provider is not your primary care provider (PCP), if your PCP is located in our provider database this encounter information will be shared with them immediately following your visit.   A [facility] MyChart account gives you access to today's visit and all your visits, tests, and labs performed at [facility] " click here if you don't have a [facility] MyChart account or go to [email or web address]  Consent: (Patient) Caitlin Patrick provided verbal consent for this virtual visit at the beginning of the encounter.  Current Medications:  Current Outpatient Medications:    HYDROcodone-acetaminophen (NORCO) 5-325 MG tablet, Take 1-2 tablets by mouth every 6 (six) hours as needed for moderate pain., Disp: 10 tablet, Rfl: 0   loperamide (IMODIUM) 2 MG capsule, Take 1 capsule (2 mg total) by mouth 4 (four) times daily as needed for diarrhea or loose stools., Disp: 12 capsule, Rfl: 0   ondansetron (ZOFRAN-ODT) 4 MG disintegrating tablet, Take 1 tablet (4 mg total) by mouth every 8 (eight) hours as needed for nausea or vomiting., Disp: 12 tablet, Rfl: 0   rivaroxaban (XARELTO) 20 MG TABS tablet, Take 1 tablet (20 mg total) by mouth daily with supper. Please give 15 mg tabs QS for use BID x 21 days, then 20 mg PO daily., Disp: 60 tablet, Rfl: 0   Medications ordered in this encounter:  No orders of the defined types were placed in this encounter.    *If you need refills on other medications prior to your next appointment, please contact your pharmacy*  Follow-Up: Call back or seek an in-person evaluation if the symptoms worsen or if the condition fails to improve as anticipated.  [facility] [phone]  Other Instructions   If you have been instructed to have an in-person evaluation today at a local Urgent Care facility, please use the link below. It will take  you to a list of all of our available [facility], including address, phone number and hours of operation. Please do not delay care.  [facility]  If you or a family member do not have a primary care provider, use the link below to schedule a visit and establish care. When you choose a [facility] primary care physician or advanced practice provider, you gain a long-term partner in health. Find a Primary Care Provider  Learn more about [facility]'s in-office and virtual care options: [facility] - Get Care Now

## 2022-10-08 NOTE — Telephone Encounter (Signed)
Dr. De Peru, please see mychart message sent by pt as well as the picture in imaging section of message. Please advise what you recommend.

## 2022-10-09 NOTE — Telephone Encounter (Signed)
de Peru, Buren Kos, MD  [phone] hours ago (5:07 PM)    Called and spoke with patient and discussed recommendations related to recently observed clots.  We did also discuss her labs which were generally reassuring with normal red blood cell count and hemoglobin and normal iron studies.  Recommended following up with her current OB/GYN for assistance in managing symptoms while patient is proceeding with anticoagulation for treatment of DVT.  Patient voiced understanding and agreement.  She will continue to monitor symptoms and will let us know if anything does worsen.  I discussed symptoms and changes to be mindful of and discussed ER precautions related to any worsening.

## 2022-10-10 NOTE — Assessment & Plan Note (Signed)
Recent diagnosis, no prior clots reported, would be considered provoked due to air travel, prolonged period of immobility.  As result, would proceed with treatment for 3 months utilizing Xarelto.  Patient has been tolerating medication without issue, needing refill today, refill sent to pharmacy on file.

## 2022-10-10 NOTE — Assessment & Plan Note (Signed)
Incidental finding on CT angiogram.  Recommendation is to repeat noncontrast CT scan in 6 to 12 months, we will proceed accordingly

## 2022-10-10 NOTE — Assessment & Plan Note (Signed)
Mild anemia observed on labs in the emergency department.  Given that she is currently taking blood thinner and insetting of menstrual period, we will assess for any worsening of anemia, labs to be completed today.  We will also assess iron storage

## 2022-10-31 ENCOUNTER — Encounter (HOSPITAL_BASED_OUTPATIENT_CLINIC_OR_DEPARTMENT_OTHER): Payer: Self-pay | Admitting: Family Medicine

## 2022-12-09 ENCOUNTER — Ambulatory Visit (INDEPENDENT_AMBULATORY_CARE_PROVIDER_SITE_OTHER): Payer: No Typology Code available for payment source | Admitting: Family Medicine

## 2022-12-09 DIAGNOSIS — I824Z9 Acute embolism and thrombosis of unspecified deep veins of unspecified distal lower extremity: Secondary | ICD-10-CM

## 2022-12-09 DIAGNOSIS — Z0000 Encounter for general adult medical examination without abnormal findings: Secondary | ICD-10-CM

## 2022-12-09 NOTE — Assessment & Plan Note (Signed)
Patient continues with Xarelto, feels that she has been doing well, [place] about 1 week remaining for this medication.  She did have some calf soreness/occasional swelling of left lower extremity since last time she was in the office, however reports that this has been improving, no issues currently with pain or swelling.  She has not had any other bleeding concerns.  She is looking into establishing with an OB/GYN locally, wants to get set up with OB/GYN here at [facility]. On exam, patient is in no acute distress, vital signs stable.  Bilateral lower extremities with no edema present currently, no tenderness to palpation to the posterior calf. We again reviewed that her DVT was felt to be provoked and as such, 3 months of anticoagulant use is typically advised which patient will be completing here shortly.  We would not necessarily need to proceed with repeat imaging given that patient has been doing much better symptomatically.  Did discuss that if any symptoms do recur, to let us know and would certainly be reasonable to proceed with updated imaging at that time to assess for DVT recurrence. Recommend that she established with OB/GYN, if they are not taking patients here, we can provide referral to alternative office locally to assist in establishing care, she will let us know if she needs assistance.

## 2022-12-09 NOTE — Progress Notes (Signed)
    Procedures performed today:    None.  Independent interpretation of notes and tests performed by another provider:   None.  Brief History, Exam, Impression, and Recommendations:    BP 110/76   Pulse 78   Temp 98.6 F (37 C) (Oral)   Ht 5\' 10"  (1.778 m)   Wt 216 lb 3.2 oz (98.1 kg)   SpO2 100%   BMI 31.02 kg/m   Wellness examination -     CBC with Differential/Platelet; Future -     Comprehensive metabolic panel; Future -     Hemoglobin A1c; Future -     Lipid panel; Future -     TSH Rfx on Abnormal to Free T4; Future  Acute deep vein thrombosis (DVT) of distal vein of lower extremity, unspecified laterality [facility]) Assessment & Plan: Patient continues with Xarelto, feels that she has been doing well, B[place]shan about 1 week remaining for this medication.  She did have some calf soreness/occasional swelling of left lower extremity since last time she was in the office, however reports that this has been improving, no issues currently with pain or swelling.  She has not had any other bleeding concerns.  She is looking into establishing with an OB/GYN locally, wants to get set up with OB/GYN here at [facility]. On exam, patient is in no acute distress, vital signs stable.  Bilateral lower extremities with no edema present currently, no tenderness to palpation to the posterior calf. We again reviewed that her DVT was felt to be provoked and as such, 3 months of anticoagulant use is typically advised which patient will be completing here shortly.  We would not necessarily need to proceed with repeat imaging given that patient has been doing much better symptomatically.  Did discuss that if any symptoms do recur, to let us know and would certainly be reasonable to proceed with updated imaging at that time to assess for DVT recurrence. Recommend that she established with OB/GYN, if they are not taking patients here, we can provide referral to alternative office locally to assist in  establishing care, she will let us know if she needs assistance.   Return in about 2 months (around 02/08/2023) for CPE with fasting labs 1 week prior.   ___________________________________________ Mckinsey Keagle de Peru, MD, ABFM, CAQSM Primary Care and Sports Medicine [facility]

## 2022-12-09 NOTE — Patient Instructions (Signed)
  Medication Instructions:  Your physician recommends that you continue on your current medications as directed. Please refer to the Current Medication list given to you today. --If you need a refill on any your medications before your next appointment, please call your pharmacy first. If no refills are authorized on file call the office.-- Lab Work: Your physician has recommended that you have lab work today: Week before next appointment If you have labs (blood work) drawn today and your tests are completely normal, you will receive your results via MyChart message OR a phone call from our staff.  Please ensure you check your voicemail in the event that you authorized detailed messages to be left on a delegated number. If you have any lab test that is abnormal or we need to change your treatment, we will call you to review the results.    Follow-Up: Your next appointment:   Your physician recommends that you schedule a follow-up appointment in: 2-3 months with labs week before with Dr. de Peru  You will receive a text message or e-mail with a link to a survey about your care and experience with [place] today! We would greatly appreciate your feedback!   Thanks for letting us be apart of your health journey!!  Primary Care and Sports Medicine   Dr. Ceasar Mons Peru   We encourage you to activate your patient portal called "MyChart".  Sign up information is provided on this After Visit Summary.  MyChart is used to connect with patients for Virtual Visits (Telemedicine).  Patients are able to view lab/test results, encounter notes, upcoming appointments, etc.  Non-urgent messages can be sent to your provider as well. To learn more about what you can do with MyChart, please visit --  [email or web address].

## 2023-02-10 ENCOUNTER — Encounter (HOSPITAL_BASED_OUTPATIENT_CLINIC_OR_DEPARTMENT_OTHER): Payer: No Typology Code available for payment source | Admitting: Family Medicine

## 2023-02-10 ENCOUNTER — Other Ambulatory Visit (HOSPITAL_BASED_OUTPATIENT_CLINIC_OR_DEPARTMENT_OTHER): Payer: Self-pay | Admitting: Family Medicine

## 2023-02-11 LAB — COMPREHENSIVE METABOLIC PANEL
ALT: 57 [IU]/L — ABNORMAL HIGH (ref 0–32)
AST: 33 [IU]/L (ref 0–40)
Albumin: 4.2 g/dL (ref 3.9–4.9)
Alkaline Phosphatase: 110 [IU]/L (ref 44–121)
BUN/Creatinine Ratio: 13 (ref 9–23)
BUN: 10 mg/dL (ref 6–20)
Bilirubin Total: <0.2 mg/dL (ref 0.0–1.2)
CO2: 21 mmol/L (ref 20–29)
Calcium: 9.8 mg/dL (ref 8.7–10.2)
Chloride: 104 mmol/L (ref 96–106)
Creatinine, Ser: 0.76 mg/dL (ref 0.57–1.00)
Globulin, Total: 2.9 g/dL (ref 1.5–4.5)
Glucose: 74 mg/dL (ref 70–99)
Potassium: 4.2 mmol/L (ref 3.5–5.2)
Sodium: 142 mmol/L (ref 134–144)
Total Protein: 7.1 g/dL (ref 6.0–8.5)
eGFR: 103 mL/min/{1.73_m2} (ref >59)

## 2023-02-11 LAB — CBC WITH DIFFERENTIAL/PLATELET
Basophils Absolute: 0 10*3/uL (ref 0.0–0.2)
Basos: 0 %
EOS (ABSOLUTE): 0.1 10*3/uL (ref 0.0–0.4)
Eos: 2 %
Hematocrit: 34.8 % (ref 34.0–46.6)
Hemoglobin: 11.4 g/dL (ref 11.1–15.9)
Immature Grans (Abs): 0 10*3/uL (ref 0.0–0.1)
Immature Granulocytes: 0 %
Lymphocytes Absolute: 3.1 10*3/uL (ref 0.7–3.1)
Lymphs: 43 %
MCH: 28.5 pg (ref 26.6–33.0)
MCHC: 32.8 g/dL (ref 31.5–35.7)
MCV: 87 fL (ref 79–97)
Monocytes Absolute: 0.5 10*3/uL (ref 0.1–0.9)
Monocytes: 7 %
Neutrophils Absolute: 3.5 10*3/uL (ref 1.4–7.0)
Neutrophils: 48 %
Platelets: 308 10*3/uL (ref 150–450)
RBC: 4 x10E6/uL (ref 3.77–5.28)
RDW: 13.7 % (ref 11.7–15.4)
WBC: 7.3 10*3/uL (ref 3.4–10.8)

## 2023-02-11 LAB — LIPID PANEL
Chol/HDL Ratio: 3.3 ratio (ref 0.0–4.4)
Cholesterol, Total: 163 mg/dL (ref 100–199)
HDL: 50 mg/dL (ref >39)
LDL Chol Calc (NIH): 96 mg/dL (ref 0–99)
Triglycerides: 92 mg/dL (ref 0–149)
VLDL Cholesterol Cal: 17 mg/dL (ref 5–40)

## 2023-02-11 LAB — TSH RFX ON ABNORMAL TO FREE T4: TSH: 1.31 u[IU]/mL (ref 0.450–4.500)

## 2023-02-11 LAB — HEMOGLOBIN A1C
Est. average glucose Bld gHb Est-mCnc: 108 mg/dL
Hgb A1c MFr Bld: 5.4 % (ref 4.8–5.6)

## 2023-02-17 ENCOUNTER — Encounter (HOSPITAL_BASED_OUTPATIENT_CLINIC_OR_DEPARTMENT_OTHER): Payer: No Typology Code available for payment source | Admitting: Family Medicine

## 2023-02-21 ENCOUNTER — Other Ambulatory Visit: Payer: Self-pay | Admitting: Medical Genetics

## 2023-02-21 DIAGNOSIS — Z006 Encounter for examination for normal comparison and control in clinical research program: Secondary | ICD-10-CM

## 2023-02-24 ENCOUNTER — Ambulatory Visit (INDEPENDENT_AMBULATORY_CARE_PROVIDER_SITE_OTHER): Payer: No Typology Code available for payment source | Admitting: Family Medicine

## 2023-02-24 ENCOUNTER — Encounter (HOSPITAL_BASED_OUTPATIENT_CLINIC_OR_DEPARTMENT_OTHER): Payer: Self-pay | Admitting: Family Medicine

## 2023-02-24 VITALS — BP 112/74 | HR 88 | Ht 70.0 in | Wt 216.5 lb

## 2023-02-24 DIAGNOSIS — Z0000 Encounter for general adult medical examination without abnormal findings: Secondary | ICD-10-CM | POA: Insufficient documentation

## 2023-02-24 DIAGNOSIS — R198 Other specified symptoms and signs involving the digestive system and abdomen: Secondary | ICD-10-CM | POA: Insufficient documentation

## 2023-02-24 DIAGNOSIS — R7401 Elevation of levels of liver transaminase levels: Secondary | ICD-10-CM | POA: Diagnosis not present

## 2023-02-24 DIAGNOSIS — N920 Excessive and frequent menstruation with regular cycle: Secondary | ICD-10-CM

## 2023-02-24 DIAGNOSIS — Z23 Encounter for immunization: Secondary | ICD-10-CM

## 2023-02-24 NOTE — Assessment & Plan Note (Signed)
Patient requesting referral to establish with OB/GYN locally, referral placed today

## 2023-02-24 NOTE — Progress Notes (Signed)
Subjective:    CC: Annual Physical Exam  HPI: Caitlin Patrick is a 37 y.o. presenting for annual physical  I reviewed the past medical history, family history, social history, surgical history, and allergies today and no changes were needed.  Please see the problem list section below in epic for further details.  Past Medical History: Past Medical History:  Diagnosis Date   DVT (deep venous thrombosis) (HCC)    Sickle cell trait (HCC)    Uterine leiomyoma 04/29/2022   Vaginal Pap smear, abnormal    Past Surgical History: Past Surgical History:  Procedure Laterality Date   CARPAL TUNNEL RELEASE Right 05/01/2021   Procedure: RIGHT CARPAL TUNNEL RELEASE;  Surgeon: Betha Loa, MD;  Location: [facility];  Service: Orthopedics;  Laterality: Right;   CESAREAN SECTION     MULTIPLE TOOTH EXTRACTIONS Left    WISDOM TOOTH EXTRACTION     Social History: Social History   Socioeconomic History   Marital status: Significant Other    Spouse name: Not on file   Number of children: Not on file   Years of education: Not on file   Highest education level: 12th grade  Occupational History   Not on file  Tobacco Use   Smoking status: Former    Passive exposure: Past   Smokeless tobacco: Never  Vaping Use   Vaping status: Never Used  Substance and Sexual Activity   Alcohol use: Not Currently    Comment: none   Drug use: Not Currently    Types: Marijuana    Comment: occasionally   Sexual activity: Not Currently    Partners: Female    Birth control/protection: None  Other Topics Concern   Not on file  Social History Narrative   Not on file   Social Determinants of Health   Financial Resource Strain: Low Risk  (02/14/2023)   Overall Financial Resource Strain (CARDIA)    Difficulty of Paying Living Expenses: Not very hard  Food Insecurity: Food Insecurity Present (02/14/2023)   Hunger Vital Sign    Worried About Running Out of Food in the Last Year: Sometimes  true    Ran Out of Food in the Last Year: Sometimes true  Transportation Needs: No Transportation Needs (02/14/2023)   PRAPARE - Administrator, Civil Service (Medical): No    Lack of Transportation (Non-Medical): No  Physical Activity: Insufficiently Active (02/14/2023)   Exercise Vital Sign    Days of Exercise per Week: 3 days    Minutes of Exercise per Session: 30 min  Stress: No Stress Concern Present (02/14/2023)   [employer] of Occupational Health - Occupational Stress Questionnaire    Feeling of Stress : Only a little  Social Connections: Moderately Isolated (02/14/2023)   Social Connection and Isolation Panel [NHANES]    Frequency of Communication with Friends and Family: More than three times a week    Frequency of Social Gatherings with Friends and Family: Once a week    Attends Religious Services: Never    Database administrator or Organizations: No    Attends Engineer, structural: Not on file    Marital Status: Living with partner   Family History: Family History  Problem Relation Age of Onset   Breast cancer Mother    Cancer Mother    Hypertension Father    Diabetes Maternal Grandmother    Diabetes Paternal Grandmother    Allergies: No Known Allergies Medications: See med rec.  Review of Systems:  No headache, visual changes, nausea, vomiting, diarrhea, constipation, dizziness, abdominal pain, skin rash, fevers, chills, night sweats, swollen lymph nodes, weight loss, chest pain, body aches, joint swelling, muscle aches, shortness of breath, mood changes, visual or auditory hallucinations.  Objective:    BP 112/74 (BP Location: Right Arm, Patient Position: Sitting, Cuff Size: Normal)   Pulse 88   Ht 5\' 10"  (1.778 m)   Wt 216 lb 8 oz (98.2 kg)   SpO2 100%   BMI 31.06 kg/m   General: Well Developed, well nourished, and in no acute distress. Neuro: Alert and oriented x3, extra-ocular muscles intact, sensation grossly intact. Cranial  nerves II through XII are intact, motor, sensory, and coordinative functions are all intact. HEENT: Normocephalic, atraumatic, pupils equal round reactive to light, neck supple, no masses, no lymphadenopathy, thyroid nonpalpable. Oropharynx, nasopharynx, external ear canals are unremarkable. Skin: Warm and dry, no rashes noted.  Cardiac: Regular rate and rhythm, no murmurs rubs or gallops. Respiratory: Clear to auscultation bilaterally. Not using accessory muscles, speaking in full sentences. Abdominal: Soft, nontender, nondistended, positive bowel sounds, no masses, no organomegaly. Musculoskeletal: Shoulder, elbow, wrist, hip, knee, ankle stable, and with full range of motion.  Impression and Recommendations:    Wellness examination Assessment & Plan: Routine HCM labs reviewed. HCM reviewed/discussed. Anticipatory guidance regarding healthy weight, lifestyle and choices given. Recommend healthy diet.  Recommend approximately 150 minutes/week of moderate intensity exercise Recommend regular dental and vision exams Always use seatbelt/lap and shoulder restraints Recommend using smoke alarms and checking batteries at least twice a year Recommend using sunscreen when outside Discussed tetanus immunization recommendations, patient agreed to proceed with this today   Need for tetanus booster  Menorrhagia with regular cycle Assessment & Plan: Patient requesting referral to establish with OB/GYN locally, referral placed today  Orders: -     Ambulatory referral to Obstetrics / Gynecology  Alternating constipation and diarrhea Assessment & Plan: Patient reports chronic intermittent issues with constipation and diarrhea.  She has not had prior evaluation with GI.  Requesting referral today, feel that this is reasonable, referral placed  Orders: -     Ambulatory referral to Gastroenterology  Elevated ALT measurement -     Hepatic function panel; Future  Return in about 4 months (around  06/24/2023) for lab follow-up and imaging.  At next appointment we will follow-up on elevated ALT measurement with repeat labs either shortly before appointment or at time of appointment.  We will also place order for noncontrast CT scan to follow-up on incidental pulmonary nodule   ___________________________________________ Trina Asch de [place], MD, ABFM, CAQSM Primary Care and Sports Medicine [facility]

## 2023-02-24 NOTE — Assessment & Plan Note (Signed)
Routine HCM labs reviewed. HCM reviewed/discussed. Anticipatory guidance regarding healthy weight, lifestyle and choices given. Recommend healthy diet.  Recommend approximately 150 minutes/week of moderate intensity exercise Recommend regular dental and vision exams Always use seatbelt/lap and shoulder restraints Recommend using smoke alarms and checking batteries at least twice a year Recommend using sunscreen when outside Discussed tetanus immunization recommendations, patient agreed to proceed with this today 

## 2023-02-24 NOTE — Assessment & Plan Note (Signed)
Patient reports chronic intermittent issues with constipation and diarrhea.  She has not had prior evaluation with GI.  Requesting referral today, feel that this is reasonable, referral placed

## 2023-03-15 ENCOUNTER — Encounter (HOSPITAL_BASED_OUTPATIENT_CLINIC_OR_DEPARTMENT_OTHER): Payer: Self-pay | Admitting: Certified Nurse Midwife

## 2023-03-17 MED ORDER — METRONIDAZOLE 500 MG PO TABS: 2000.0000 mg | ORAL_TABLET | Freq: Once | ORAL | 0 refills | Status: AC

## 2023-03-19 ENCOUNTER — Other Ambulatory Visit (HOSPITAL_COMMUNITY): Payer: Medicaid Other | Attending: Medical Genetics

## 2023-03-27 ENCOUNTER — Ambulatory Visit (HOSPITAL_BASED_OUTPATIENT_CLINIC_OR_DEPARTMENT_OTHER): Payer: Medicaid Other | Admitting: Certified Nurse Midwife

## 2023-04-16 ENCOUNTER — Other Ambulatory Visit: Payer: Self-pay

## 2023-04-16 ENCOUNTER — Emergency Department (HOSPITAL_BASED_OUTPATIENT_CLINIC_OR_DEPARTMENT_OTHER)
Admission: EM | Admit: 2023-04-16 | Discharge: 2023-04-16 | Disposition: A | Discharge status: Home / Self Care | Payer: No Typology Code available for payment source | Attending: Emergency Medicine | Admitting: Emergency Medicine

## 2023-04-16 DIAGNOSIS — H9201 Otalgia, right ear: Secondary | ICD-10-CM | POA: Diagnosis present

## 2023-04-16 DIAGNOSIS — H60501 Unspecified acute noninfective otitis externa, right ear: Secondary | ICD-10-CM | POA: Diagnosis not present

## 2023-04-16 MED ORDER — CIPROFLOXACIN HCL 500 MG PO TABS: 500.0000 mg | ORAL_TABLET | Freq: Two times a day (BID) | ORAL | 0 refills | Status: DC

## 2023-04-16 MED ORDER — CIPROFLOXACIN HCL 500 MG PO TABS
500.0000 mg | ORAL_TABLET | Freq: Once | ORAL | Status: AC
  Administered 2023-04-16: 500 mg via ORAL
  Filled 2023-04-16: qty 1

## 2023-04-16 NOTE — ED Notes (Signed)
 RN reviewed discharge instructions with pt. Pt verbalized understanding and had no further questions. VSS upon discharge.  

## 2023-04-16 NOTE — ED Provider Notes (Signed)
[place] EMERGENCY DEPARTMENT AT [facility] Provider Note   CSN: [id number] Arrival date & time: 04/16/23  2118     History  No chief complaint on file.   Caitlin Patrick is a 37 y.o. female presents today for right ear fullness and discharge x 3 days.  Patient endorses swelling spreading to below ear on neck and jaw.  Patient denies fever, chills, cough, sore throat, mastoid tenderness, or headache.  HPI     Home Medications Prior to Admission medications   Medication Sig Start Date End Date Taking? Authorizing Provider  ciprofloxacin (CIPRO) 500 MG tablet Take 1 tablet (500 mg total) by mouth 2 (two) times daily. 04/16/23  Yes Dolphus Jenny, PA-C  HYDROcodone-acetaminophen (NORCO) 5-325 MG tablet Take 1-2 tablets by mouth every 6 (six) hours as needed for moderate pain. Patient not taking: Reported on 02/24/2023 08/27/22   Hurshel Party, CNM      Allergies    Patient has no known allergies.    Review of Systems   Review of Systems  Eyes:  Positive for pain.    Physical Exam Updated Vital Signs BP 123/75 (BP Location: Right Arm)   Pulse 73   Temp (!) 97 F (36.1 C)   Resp 18   SpO2 100%  Physical Exam Vitals and nursing note reviewed.  Constitutional:      General: She is not in acute distress.    Appearance: She is well-developed.  HENT:     Head: Normocephalic and atraumatic.     Right Ear: Tympanic membrane normal. Drainage, swelling and tenderness present. No mastoid tenderness.     Left Ear: Tympanic membrane and ear canal normal.     Ears:     Comments: Right ear canal was erythematous with mild swelling.  The right ear was tender with some drainage noted on the upper auricle.  Patient also had mild swelling without erythema in front of the tragus and down the jaw.    Nose: Nose normal.     Mouth/Throat:     Mouth: Mucous membranes are moist.  Eyes:     Extraocular Movements: Extraocular movements intact.     Conjunctiva/sclera:  Conjunctivae normal.  Cardiovascular:     Rate and Rhythm: Normal rate and regular rhythm.     Pulses: Normal pulses.     Heart sounds: Normal heart sounds. No murmur heard. Pulmonary:     Effort: Pulmonary effort is normal. No respiratory distress.     Breath sounds: Normal breath sounds.  Abdominal:     Palpations: Abdomen is soft.     Tenderness: There is no abdominal tenderness.  Musculoskeletal:        General: No swelling.     Cervical back: Neck supple.  Skin:    General: Skin is warm and dry.     Capillary Refill: Capillary refill takes less than 2 seconds.  Neurological:     Mental Status: She is alert.  Psychiatric:        Mood and Affect: Mood normal.     ED Results / Procedures / Treatments   Labs (all labs ordered are listed, but only abnormal results are displayed) Labs Reviewed - No data to display  EKG None  Radiology No results found.  Procedures Procedures    Medications Ordered in ED Medications  ciprofloxacin (CIPRO) tablet 500 mg (has no administration in time range)    ED Course/ Medical Decision Making/ A&P  Medical Decision Making Risk Prescription drug management.   This patient presents to the ED with chief complaint(s) of ear pain with pertinent past medical history of none which further complicates the presenting complaint. The complaint involves an extensive differential diagnosis and also carries with it a high risk of complications and morbidity.    The differential diagnosis includes otitis media, otitis externa, mastoiditis ED Course and Reassessment: Given first dose of ciprofloxacin  Consultation: - Consulted or discussed management/test interpretation w/ external professional: None  Consideration for admission or further workup: Considered for mission further workup however patient's vital signs and physical exam were reassuring.  Patient likely has otitis externa extending beyond the ear  canal and will be placed on outpatient oral ciprofloxacin for 10 days.  Patient should follow-up with PCP if symptoms persist for further evaluation and workup.         Final Clinical Impression(s) / ED Diagnoses Final diagnoses:  Acute otitis externa of right ear, unspecified type    Rx / DC Orders ED Discharge Orders          Ordered    ciprofloxacin (CIPRO) 500 MG tablet  2 times daily        04/16/23 2213              Dolphus Jenny, PA-C 04/16/23 2217    Anders Simmonds T, DO 04/17/23 1643

## 2023-04-16 NOTE — Discharge Instructions (Signed)
Today you were seen for otitis externa.  Please pick up your antibiotic and take as prescribed.  Follow-up with your PCP if your symptoms persist for further evaluation and workup.  Thank you for letting us treat you today. After performing a physical exam, I feel you are safe to go home. Please follow up with your PCP in the next several days and provide them with your records from this visit. Return to the Emergency Room if pain becomes severe or symptoms worsen.

## 2023-04-16 NOTE — ED Triage Notes (Signed)
Right ear fullness 3days. Afebrile. Some discharge from a sore on upper auricle. Reports swelling has spread to below ear and neck of same side.

## 2023-04-17 ENCOUNTER — Encounter (HOSPITAL_BASED_OUTPATIENT_CLINIC_OR_DEPARTMENT_OTHER): Payer: Self-pay | Admitting: Family Medicine

## 2023-04-21 ENCOUNTER — Ambulatory Visit (INDEPENDENT_AMBULATORY_CARE_PROVIDER_SITE_OTHER): Payer: No Typology Code available for payment source | Admitting: Family Medicine

## 2023-04-21 ENCOUNTER — Encounter (HOSPITAL_BASED_OUTPATIENT_CLINIC_OR_DEPARTMENT_OTHER): Payer: Self-pay | Admitting: Family Medicine

## 2023-04-21 VITALS — BP 119/70 | HR 72 | Ht 70.0 in | Wt 214.3 lb

## 2023-04-21 DIAGNOSIS — H9201 Otalgia, right ear: Secondary | ICD-10-CM

## 2023-04-21 NOTE — Progress Notes (Signed)
   Acute Office Visit  Subjective:     Patient ID: Caitlin Patrick, female    DOB: 1985/11/20, 37 y.o.   MRN: [id number]  Chief Complaint  Patient presents with   Ear Pain    Ongoing since 12/24, started on cipro 12/25. Pain is worse at night, swelling has went down a bit. Antibiotic is giving diarrhea and making her nauseous    Caitlin Patrick is a 37 year old female patient who presents today for ongoing R ear pain. She noticed ear itching on 12/23 and pain started on 12/24.  Patient was seen on 12/25 and was given cipro BID x10 days and is currently on day 5.  She feels her swelling has decreased slightly but is still having ear pain (mostly at night).  "Most of the pain was outside at first and now it's inside."  Denies fever/chills, tinnitus, ear drainage, swelling of outer ear. Reports nausea & diarrhea with cipro . She does report that she did notice a small wound outer portion of her outer ear and was tender.  Denies current pain or tenderness of outer ear. Headaches were occurring when the ear pain first started but have since resolved.   Review of Systems  Constitutional:  Negative for chills, fever and malaise/fatigue.  HENT:  Negative for ear discharge, ear pain and tinnitus.   Neurological:  Negative for dizziness and headaches.  See HPI for details.     Objective:    BP 119/70   Pulse 72   Ht 5\' 10"  (1.778 m)   Wt 214 lb 4.8 oz (97.2 kg)   SpO2 99%   BMI 30.75 kg/m   Physical Exam Vitals reviewed.  Constitutional:      Appearance: Normal appearance.  HENT:     Right Ear: Hearing, tympanic membrane, ear canal and external ear normal.     Left Ear: Hearing, tympanic membrane and ear canal normal. Laceration (small 0.31mm wound present on antihelix of R ear healing) present. No drainage, swelling or tenderness.  Cardiovascular:     Rate and Rhythm: Normal rate and regular rhythm.     Pulses: Normal pulses.     Heart sounds: Normal heart sounds.  Pulmonary:      Effort: Pulmonary effort is normal.     Breath sounds: Normal breath sounds.  Neurological:     Mental Status: She is alert.  Psychiatric:        Mood and Affect: Mood normal.        Behavior: Behavior normal.    Assessment & Plan:   1. Acute pain of right ear (Primary) Patient is a pleasant 37 year old female who presents today for right ear pain.  She was seen in the ED on 12/25 and given ciprofloxacin for 10 days.  She reports the swelling on the right side of her face, that outer portion of her right ear, and the drainage have resolved.  Denies fever, chills, abdominal pain.  Physical exam with bilateral tympanic membranes clear, intact with easily visible.  No erythema, fluid or infection present. Provided patient with reassurance that her right ear is healing well and does not appear infected. Discussed continue antibiotic until full course is completed. Counseled about OTC ear pain relief drops. Advised patient to return to office if symptoms do not resolve.    Return if symptoms worsen or fail to improve.  Alyson Reedy, FNP

## 2023-06-01 NOTE — Progress Notes (Deleted)
 06/01/2023 Caitlin Patrick [id number] [birth year]   CHIEF COMPLAINT: Diarrhea and constipation   HISTORY OF PRESENT ILLNESS: Caitlin Patrick is 38 year old female with a past medical history of sickle cell trait, DVT on Xarelto She presents to our office today as referred by Dr. Sinclair Ship for further evaluation regarding diarrhea and constipation.      Latest Ref Rng & Units 02/10/2023    2:57 PM 10/07/2022    2:49 PM 09/16/2022    1:39 PM  CBC  WBC 3.4 - 10.8 x10E3/uL 7.3  6.9  6.6   Hemoglobin 11.1 - 15.9 g/dL 82.9  56.2  13.0   Hematocrit 34.0 - 46.6 % 34.8  37.4  34.7   Platelets 150 - 450 x10E3/uL 308  341  304        Latest Ref Rng & Units 02/10/2023    2:57 PM 09/16/2022    1:39 PM 09/11/2022    4:37 PM  CMP  Glucose 70 - 99 mg/dL 74  81  92   BUN 6 - 20 mg/dL 10  6  6    Creatinine 0.57 - 1.00 mg/dL 8.65  7.84  6.96   Sodium 134 - 144 mmol/L 142  139  140   Potassium 3.5 - 5.2 mmol/L 4.2  3.9  3.6   Chloride 96 - 106 mmol/L 104  104  105   CO2 20 - 29 mmol/L 21  29  28    Calcium 8.7 - 10.2 mg/dL 9.8  9.2  9.5   Total Protein 6.0 - 8.5 g/dL 7.1     Total Bilirubin 0.0 - 1.2 mg/dL <2.9     Alkaline Phos 44 - 121 IU/L 110     AST 0 - 40 IU/L 33     ALT 0 - 32 IU/L 57        Past Medical History:  Diagnosis Date   DVT (deep venous thrombosis) (HCC)    Sickle cell trait (HCC)    Uterine leiomyoma 04/29/2022   Vaginal Pap smear, abnormal    Past Surgical History:  Procedure Laterality Date   CARPAL TUNNEL RELEASE Right 05/01/2021   Procedure: RIGHT CARPAL TUNNEL RELEASE;  Surgeon: Betha Loa, MD;  Location: [facility];  Service: Orthopedics;  Laterality: Right;   CESAREAN SECTION     MULTIPLE TOOTH EXTRACTIONS Left    WISDOM TOOTH EXTRACTION     Social History:  Family History:    reports that she has quit smoking. She has been exposed to tobacco smoke. She has never used smokeless tobacco. She reports that she does not  currently use alcohol. She reports that she does not currently use drugs after having used the following drugs: Marijuana. family history includes Breast cancer in her mother; Cancer in her mother; Diabetes in her maternal grandmother and paternal grandmother; Hypertension in her father.  No Known Allergies    Outpatient Encounter Medications as of 06/02/2023  Medication Sig   ciprofloxacin (CIPRO) 500 MG tablet Take 1 tablet (500 mg total) by mouth 2 (two) times daily.   No facility-administered encounter medications on file as of 06/02/2023.     REVIEW OF SYSTEMS:  Gen: Denies fever, sweats or chills. No weight loss.  CV: Denies chest pain, palpitations or edema. Resp: Denies cough, shortness of breath of hemoptysis.  GI: Denies heartburn, dysphagia, stomach or lower abdominal pain. No diarrhea or constipation.  GU: Denies urinary burning, blood in urine, increased urinary frequency or  incontinence. MS: Denies joint pain, muscles aches or weakness. Derm: Denies rash, itchiness, skin lesions or unhealing ulcers. Psych: Denies depression, anxiety, memory loss or confusion. Heme: Denies bruising, easy bleeding. Neuro:  Denies headaches, dizziness or paresthesias. Endo:  Denies any problems with DM, thyroid or adrenal function.  PHYSICAL EXAM: There were no vitals taken for this visit. General: in no acute distress. Head: Normocephalic and atraumatic. Eyes:  Sclerae non-icteric, conjunctive pink. Ears: Normal auditory acuity. Mouth: Dentition intact. No ulcers or lesions.  Neck: Supple, no lymphadenopathy or thyromegaly.  Lungs: Clear bilaterally to auscultation without wheezes, crackles or rhonchi. Heart: Regular rate and rhythm. No murmur, rub or gallop appreciated.  Abdomen: Soft, nontender, nondistended. No masses. No hepatosplenomegaly. Normoactive bowel sounds x 4 quadrants.  Rectal: Deferred.  Musculoskeletal: Symmetrical with no gross deformities. Skin: Warm and dry. No  rash or lesions on visible extremities. Extremities: No edema. Neurological: Alert oriented x 4, no focal deficits.  Psychological:  Alert and cooperative. Normal mood and affect.  ASSESSMENT AND PLAN:  38 year old female with constipation   Mildly elevated ALT level       CC:  de [place], Buren Kos, MD

## 2023-06-02 ENCOUNTER — Ambulatory Visit: Payer: Medicaid Other | Admitting: Nurse Practitioner

## 2023-06-19 IMAGING — CT CT ABD-PELV W/ CM
2 of 4 series · 16 of 46 positions shown, 18 images · IV contrast (APPLIED)
Comparison: None.

CLINICAL DATA: Nausea vomiting with abdominal pain. Abdominal
cramping.

EXAM:
CT ABDOMEN AND PELVIS WITH CONTRAST
TECHNIQUE: Multidetector CT imaging of the abdomen and pelvis was performed
using the standard protocol following bolus administration of
intravenous contrast.

[Series 2: abd pel w · axial · 0.72mm/px · z∈[-338,+102]mm · 13 of 96 slices shown, 15 images]
[im 4/96  soft-tissue]
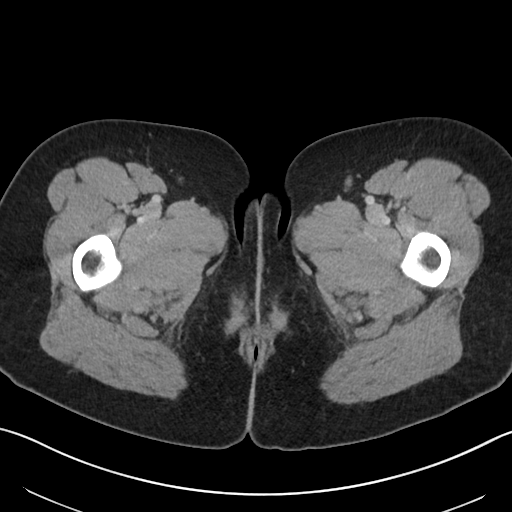
[im 4/96  bone]
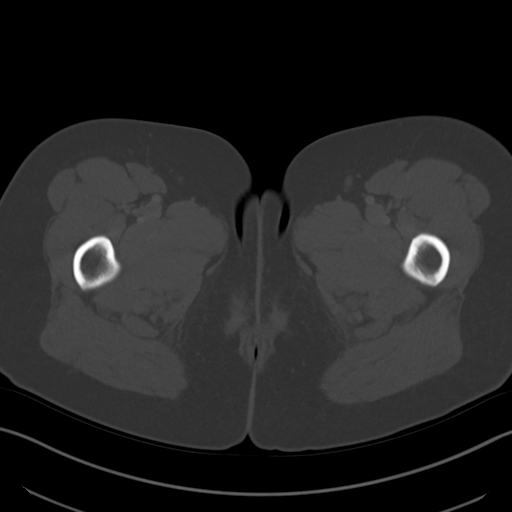
[im 12/96  soft-tissue]
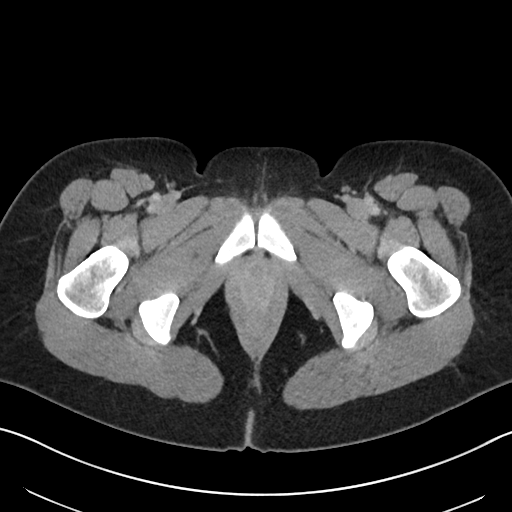
[im 20/96  soft-tissue]
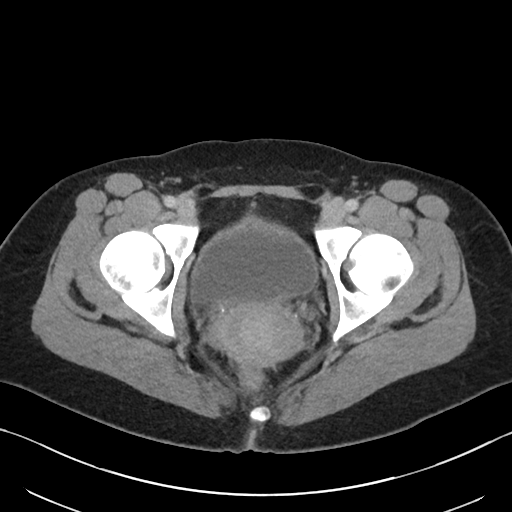
[im 27/96  soft-tissue]
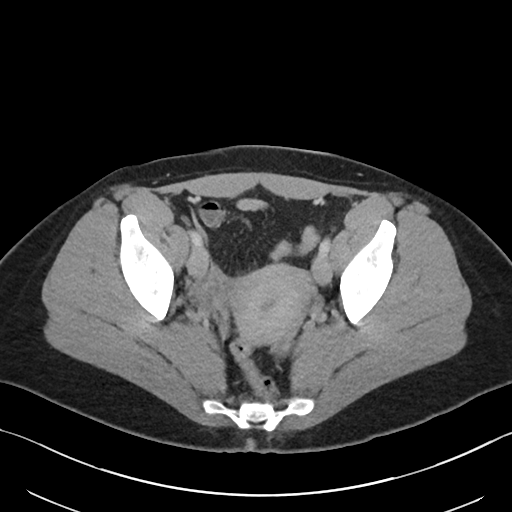
[im 35/96  soft-tissue]
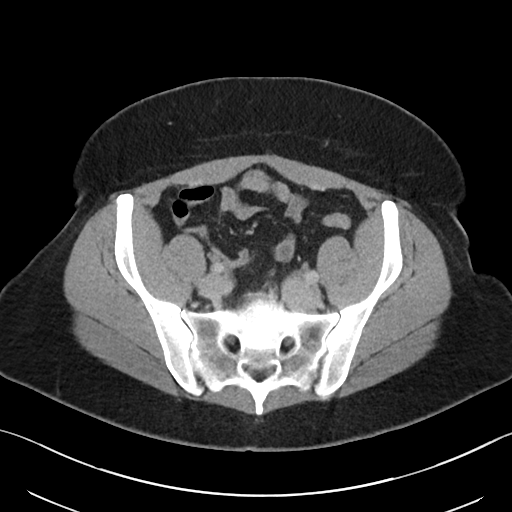
[im 42/96  soft-tissue]
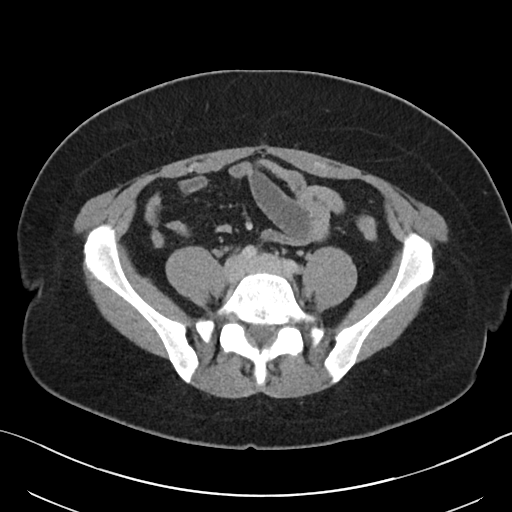
[im 50/96  soft-tissue]
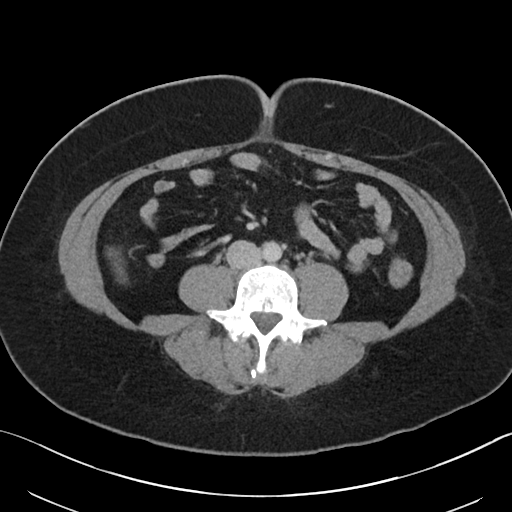
[im 54/96  soft-tissue]
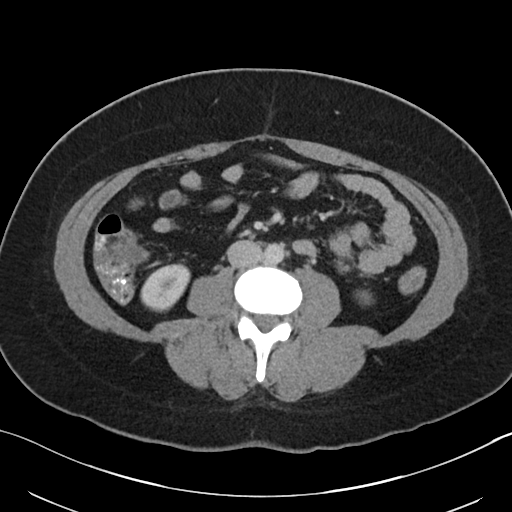
[im 61/96  soft-tissue]
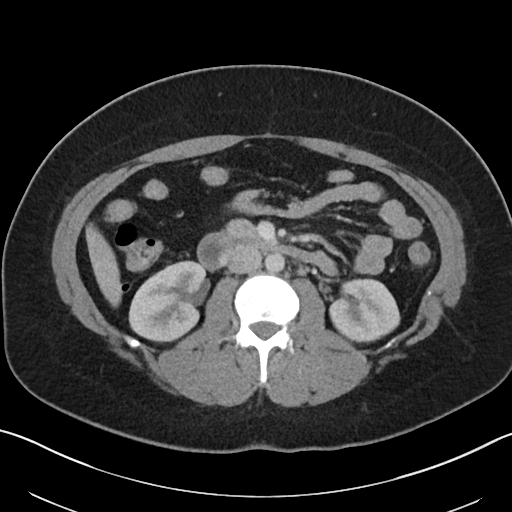
[im 61/96  bone]
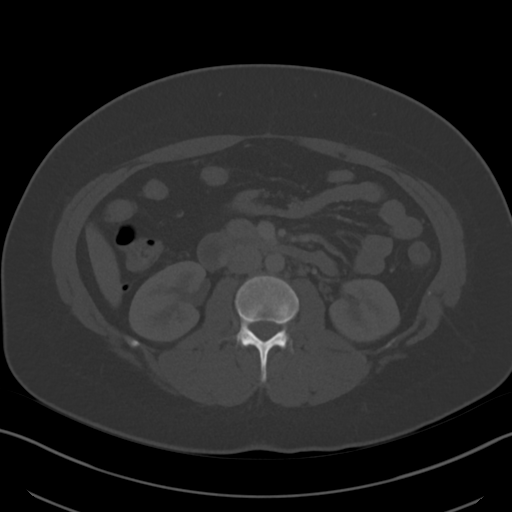
[im 69/96  soft-tissue]
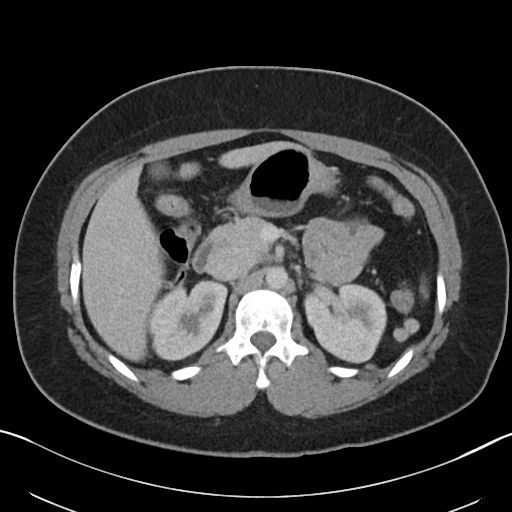
[im 77/96  soft-tissue]
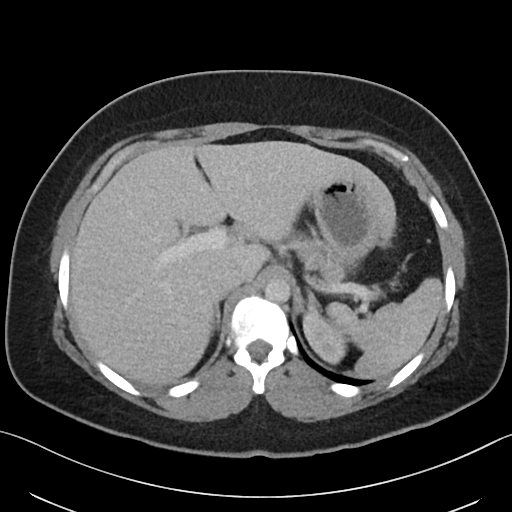
[im 84/96  soft-tissue]
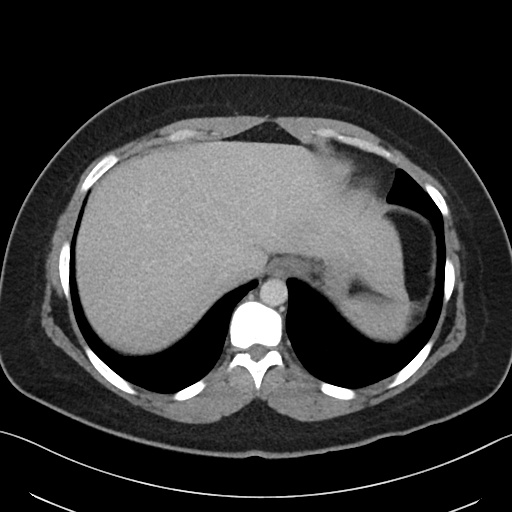
[im 92/96  soft-tissue]
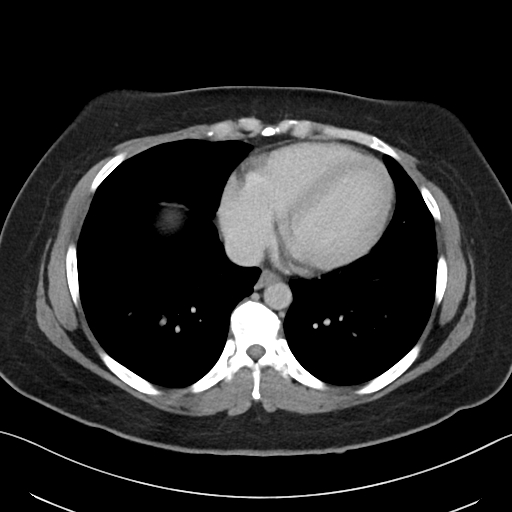

[Series 5: coronal · coronal · 0.76mm/px · 3 of 96 slices shown]
[im 32/96  soft-tissue]
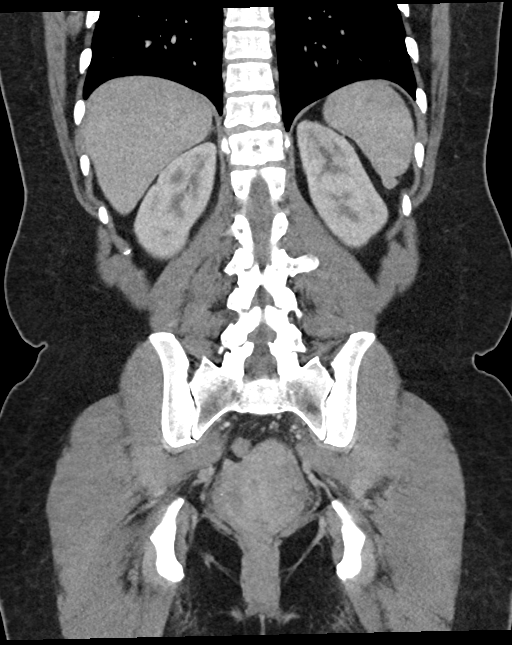
[im 43/96  soft-tissue]
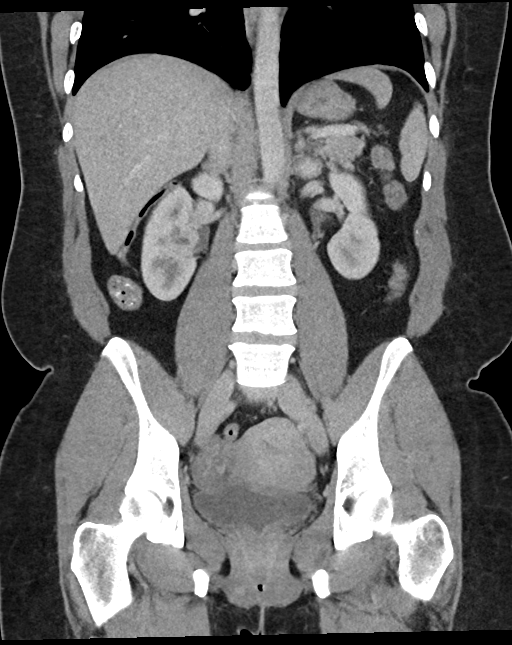
[im 53/96  soft-tissue]
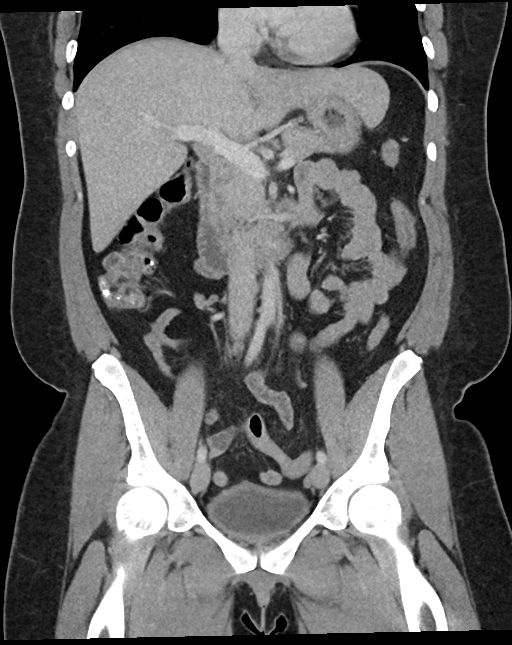

[16 of 46 positions shown; findings below may reference images not displayed]

RADIATION DOSE REDUCTION: This exam was performed according to the
departmental dose-optimization program which includes automated
exposure control, adjustment of the mA and/or kV according to
patient size and/or use of iterative reconstruction technique.

CONTRAST:  80mL OMNIPAQUE IOHEXOL 300 MG/ML  SOLN
FINDINGS: Lower chest: Unremarkable.

Hepatobiliary: No suspicious focal abnormality within the liver
parenchyma. There is no evidence for gallstones, gallbladder wall
thickening, or pericholecystic fluid. No intrahepatic or
extrahepatic biliary dilation.

Pancreas: No focal mass lesion. No dilatation of the main duct. No
intraparenchymal cyst. No peripancreatic edema.

Spleen: No splenomegaly. No focal mass lesion.

Adrenals/Urinary Tract: No adrenal nodule or mass. Kidneys
unremarkable. No evidence for hydroureter. The urinary bladder
appears normal for the degree of distention.

Stomach/Bowel: Stomach is unremarkable. No gastric wall thickening.
No evidence of outlet obstruction. Duodenum is normally positioned
as is the ligament of Treitz. No small bowel wall thickening. No
small bowel dilatation. The terminal ileum is normal. The appendix
is normal. No gross colonic mass. No colonic wall thickening.

Vascular/Lymphatic: No abdominal aortic aneurysm. No abdominal
aortic atherosclerotic calcification. There is no gastrohepatic or
hepatoduodenal ligament lymphadenopathy. No retroperitoneal or
mesenteric lymphadenopathy. No pelvic sidewall lymphadenopathy.

Reproductive: The uterus is unremarkable.  There is no adnexal mass.

Other: No intraperitoneal free fluid.

Musculoskeletal: No worrisome lytic or sclerotic osseous
abnormality.
IMPRESSION: No acute findings in the abdomen or pelvis. Specifically, no
findings to explain the patient's history of nausea and vomiting.

## 2023-06-25 ENCOUNTER — Encounter (HOSPITAL_BASED_OUTPATIENT_CLINIC_OR_DEPARTMENT_OTHER): Payer: Self-pay | Admitting: Family Medicine

## 2023-06-25 ENCOUNTER — Ambulatory Visit (INDEPENDENT_AMBULATORY_CARE_PROVIDER_SITE_OTHER): Payer: Medicaid Other | Admitting: Family Medicine

## 2023-06-25 ENCOUNTER — Other Ambulatory Visit (HOSPITAL_BASED_OUTPATIENT_CLINIC_OR_DEPARTMENT_OTHER): Payer: Self-pay | Admitting: Family Medicine

## 2023-06-25 VITALS — BP 117/78 | HR 74 | Ht 70.0 in | Wt 224.8 lb

## 2023-06-25 DIAGNOSIS — M79605 Pain in left leg: Secondary | ICD-10-CM | POA: Diagnosis not present

## 2023-06-25 DIAGNOSIS — Z0000 Encounter for general adult medical examination without abnormal findings: Secondary | ICD-10-CM

## 2023-06-25 DIAGNOSIS — R911 Solitary pulmonary nodule: Secondary | ICD-10-CM | POA: Diagnosis not present

## 2023-06-25 NOTE — Assessment & Plan Note (Signed)
 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  With this job, she has noticed increased left leg symptoms with some associated knee discomfort, stiffness.  Has also had some discomfort through her left calf.  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  She does feel that she has had slight swelling in the left leg as compared to right, indicating that she may notice indentation from her sock at the end of the day.  No issues with shortness of breath or trouble breathing. On exam, patient is in no acute distress, vital signs stable.  Left leg with no tenderness to palpation through calf posteriorly.  No tenderness to palpation about the knee, normal range of motion with left knee.  No edema noted in the left lower extremity.  Calf circumference equal bilaterally. Discussed that it is less likely that she has current issue with DVT in left lower extremity.  Discussed consideration is related to associated knee pain, dependent swelling towards the end of the day.  Discussed possibility for some underlying vein injury from prior DVT which could contribute to slight increase in swelling as compared to right leg.  Could look to utilize compression stocking to help with symptoms.

## 2023-06-25 NOTE — Patient Instructions (Signed)
  Medication Instructions:  Your physician recommends that you continue on your current medications as directed. Please refer to the Current Medication list given to you today. --If you need a refill on any your medications before your next appointment, please call your pharmacy first. If no refills are authorized on file call the office.-- Lab Work: Your physician has recommended that you have lab work today: 1 week before next visit  If you have labs (blood work) drawn today and your tests are completely normal, you will receive your results via MyChart message OR a phone call from our staff.  Please ensure you check your voicemail in the event that you authorized detailed messages to be left on a delegated number. If you have any lab test that is abnormal or we need to change your treatment, we will call you to review the results.  Referrals/Procedures/Imaging: [facility]  Follow-Up: Your next appointment:   Your physician recommends that you schedule a follow-up appointment in: 8-9 months physical  with Dr. de Peru  You will receive a text message or e-mail with a link to a survey about your care and experience with [place] today! We would greatly appreciate your feedback!   Thanks for letting us be apart of your health journey!!  Primary Care and Sports Medicine   Dr. Ceasar Mons Peru   We encourage you to activate your patient portal called "MyChart".  Sign up information is provided on this After Visit Summary.  MyChart is used to connect with patients for Virtual Visits (Telemedicine).  Patients are able to view lab/test results, encounter notes, upcoming appointments, etc.  Non-urgent messages can be sent to your provider as well. To learn more about what you can do with MyChart, please visit --  [email or web address].

## 2023-06-25 NOTE — Addendum Note (Signed)
 Addended by: DE Peru, Eulogia Dismore J on: 06/25/2023 12:06 PM   Modules accepted: Orders, Level of Service

## 2023-06-25 NOTE — Assessment & Plan Note (Signed)
 Noted on prior imaging with recommendation for follow-up CT scan in 6 to 12 months.  She is within window to arrange for follow-up CT scan, amenable to proceeding with this, order placed today.

## 2023-06-25 NOTE — Progress Notes (Signed)
    Procedures performed today:    None.  Independent interpretation of notes and tests performed by another provider:   None.  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  With this job, she has noticed increased left leg symptoms with some associated knee discomfort, stiffness.  Has also had some discomfort through her left calf.  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  She does feel that she has had slight swelling in the left leg as compared to right, indicating that she may notice indentation from her sock at the end of the day.  No issues with shortness of breath or trouble breathing. On exam, patient is in no acute distress, vital signs stable.  Left leg with no tenderness to palpation through calf posteriorly.  No tenderness to palpation about the knee, normal range of motion with left knee.  No edema noted in the left lower extremity.  Calf circumference equal bilaterally. Discussed that it is less likely that she has current issue with DVT in left lower extremity.  Discussed consideration is related to associated knee pain, dependent swelling towards the end of the day.  Discussed possibility for some underlying vein injury from prior DVT which could contribute to slight increase in swelling as compared to right leg.  Could look to utilize compression stocking to help with symptoms.   Pulmonary nodule Assessment & Plan: Noted on prior imaging with recommendation for follow-up CT scan in 6 to 12 months.  She is within window to arrange for follow-up CT scan, amenable to proceeding with this, order placed today.  Orders: -     CT CHEST WO CONTRAST;  Future  Return in about 8 months (around 02/25/2024) for CPE with fasting labs 1 week prior.   ___________________________________________ Gentry Pilson de Peru, MD, ABFM, CAQSM Primary Care and Sports Medicine [facility]

## 2023-06-26 ENCOUNTER — Encounter (HOSPITAL_BASED_OUTPATIENT_CLINIC_OR_DEPARTMENT_OTHER): Payer: Self-pay | Admitting: Family Medicine

## 2023-06-26 ENCOUNTER — Encounter (INDEPENDENT_AMBULATORY_CARE_PROVIDER_SITE_OTHER): Payer: Self-pay

## 2023-06-26 LAB — HEPATIC FUNCTION PANEL
ALT: 14 IU/L (ref 0–32)
AST: 17 IU/L (ref 0–40)
Albumin: 4 g/dL (ref 3.9–4.9)
Alkaline Phosphatase: 99 IU/L (ref 44–121)
Bilirubin Total: 0.2 mg/dL (ref 0.0–1.2)
Bilirubin, Direct: 0.1 mg/dL (ref 0.00–0.40)
Total Protein: 7 g/dL (ref 6.0–8.5)

## 2023-07-18 ENCOUNTER — Ambulatory Visit
Admission: RE | Admit: 2023-07-18 | Discharge: 2023-07-18 | Disposition: A | Discharge status: Home / Self Care | Source: Ambulatory Visit | Attending: Family Medicine | Admitting: Family Medicine

## 2023-07-18 DIAGNOSIS — R911 Solitary pulmonary nodule: Secondary | ICD-10-CM

## 2023-08-13 ENCOUNTER — Encounter (INDEPENDENT_AMBULATORY_CARE_PROVIDER_SITE_OTHER): Payer: Self-pay

## 2023-10-13 ENCOUNTER — Other Ambulatory Visit

## 2023-10-29 ENCOUNTER — Ambulatory Visit: Admitting: Internal Medicine

## 2023-11-10 ENCOUNTER — Ambulatory Visit

## 2023-11-10 ENCOUNTER — Ambulatory Visit
Admission: EM | Admit: 2023-11-10 | Discharge: 2023-11-10 | Disposition: A | Discharge status: Home / Self Care | Attending: Physician Assistant | Admitting: Physician Assistant

## 2023-11-10 ENCOUNTER — Other Ambulatory Visit: Payer: Self-pay

## 2023-11-10 ENCOUNTER — Telehealth: Payer: Self-pay | Admitting: *Deleted

## 2023-11-10 ENCOUNTER — Encounter (HOSPITAL_BASED_OUTPATIENT_CLINIC_OR_DEPARTMENT_OTHER): Payer: Self-pay | Admitting: Family Medicine

## 2023-11-10 ENCOUNTER — Ambulatory Visit (INDEPENDENT_AMBULATORY_CARE_PROVIDER_SITE_OTHER): Admitting: Radiology

## 2023-11-10 DIAGNOSIS — S6710XA Crushing injury of unspecified finger(s), initial encounter: Secondary | ICD-10-CM

## 2023-11-10 DIAGNOSIS — S67190A Crushing injury of right index finger, initial encounter: Secondary | ICD-10-CM | POA: Diagnosis not present

## 2023-11-10 DIAGNOSIS — M79644 Pain in right finger(s): Secondary | ICD-10-CM

## 2023-11-10 NOTE — ED Provider Notes (Signed)
 [facility]    CSN: [id number] Arrival date & time: 11/10/23  1720      History   Chief Complaint Chief Complaint  Patient presents with   Finger Injury    HPI Caitlin Patrick is a 38 y.o. female.   HPI   Pt presents today for concerns of numbness and tingling in her right index finger She reports that she slammed the finger in her car door on 11/05/23  She reports she is having numbness, tingling and she cannot fully flex the finger to make a tight fist She reports it is still swollen  When she initially injured it, it was more swollen and there was some broken skin over the PIP joint and proximal phalanx She states this is where most of the numbness and tingling is at right now  Intervention: Tylenol , ibuprofen , she put it in a finger splint and applied ice pack She states she did not wear splint very long as it put too much pressure on the finger and caused pain     Past Medical History:  Diagnosis Date   DVT (deep venous thrombosis) (HCC)    Sickle cell trait (HCC)    Uterine leiomyoma 04/29/2022   Vaginal Pap smear, abnormal     Patient Active Problem List   Diagnosis Date Noted   Left leg pain 06/25/2023   Wellness examination 02/24/2023   Alternating constipation and diarrhea 02/24/2023   Menorrhagia with regular cycle 02/24/2023   Anemia 10/07/2022   DVT (deep venous thrombosis) (HCC) 10/07/2022   Pulmonary nodule 10/07/2022   Mass of lower outer quadrant of left breast 04/29/2022   Chronic pelvic pain in female 04/29/2022   Uterine leiomyoma 04/29/2022   Migraine with aura and without status migrainosus, not intractable 07/15/2016   Family history of diabetes mellitus (DM) 04/23/2013   BMI 31.0-31.9,adult 04/23/2013    Past Surgical History:  Procedure Laterality Date   CARPAL TUNNEL RELEASE Right 05/01/2021   Procedure: RIGHT CARPAL TUNNEL RELEASE;  Surgeon: Murrell Drivers, MD;  Location: [facility];  Service:  Orthopedics;  Laterality: Right;   CESAREAN SECTION     MULTIPLE TOOTH EXTRACTIONS Left    WISDOM TOOTH EXTRACTION      OB History     Gravida  2   Para  1   Term  1   Preterm      AB  1   Living  1      SAB      IAB  1   Ectopic      Multiple      Live Births  1            Home Medications    Prior to Admission medications   Not on File    Family History Family History  Problem Relation Age of Onset   Breast cancer Mother    Cancer Mother    Hypertension Father    Diabetes Maternal Grandmother    Diabetes Paternal Grandmother     Social History Social History   Tobacco Use   Smoking status: Former    Passive exposure: Past   Smokeless tobacco: Never  Vaping Use   Vaping status: Never Used  Substance Use Topics   Alcohol use: Not Currently    Comment: none   Drug use: Not Currently    Types: Marijuana    Comment: occasionally     Allergies   Patient has no known allergies.   Review  of Systems Review of Systems   Physical Exam Triage Vital Signs ED Triage Vitals  Encounter Vitals Group     BP 11/10/23 1810 137/86     Girls Systolic BP Percentile --      Girls Diastolic BP Percentile --      Boys Systolic BP Percentile --      Boys Diastolic BP Percentile --      Pulse Rate 11/10/23 1810 71     Resp 11/10/23 1810 16     Temp 11/10/23 1810 98.6 F (37 C)     Temp Source 11/10/23 1810 Oral     SpO2 11/10/23 1810 97 %     Weight 11/10/23 1810 230 lb (104.3 kg)     Height 11/10/23 1810 5' 10 (1.778 m)     Head Circumference --      Peak Flow --      Pain Score 11/10/23 1820 2     Pain Loc --      Pain Education --      Exclude from Growth Chart --    No data found.  Updated Vital Signs BP 137/86 (BP Location: Right Arm)   Pulse 71   Temp 98.6 F (37 C) (Oral)   Resp 16   Ht 5' 10 (1.778 m)   Wt 230 lb (104.3 kg)   LMP 11/08/2023 (Exact Date)   SpO2 97%   BMI 33.00 kg/m   Visual Acuity Right Eye  Distance:   Left Eye Distance:   Bilateral Distance:    Right Eye Near:   Left Eye Near:    Bilateral Near:     Physical Exam Vitals reviewed.  Constitutional:      General: She is awake.     Appearance: Normal appearance. She is well-developed and well-groomed.  HENT:     Head: Normocephalic and atraumatic.  Eyes:     General: Lids are normal. Gaze aligned appropriately.     Extraocular Movements: Extraocular movements intact.     Conjunctiva/sclera: Conjunctivae normal.  Pulmonary:     Effort: Pulmonary effort is normal.  Musculoskeletal:     Right hand: No swelling, tenderness or bony tenderness. Decreased range of motion. Normal strength. Normal capillary refill. Normal pulse.     Comments: Pt is able to flex entirety of the right index finger but she is not able to make it into a tight flex to form a fist She is able to fully extend the finger Cap refill is less than 2 seconds at distal aspect of right index finger   Neurological:     Mental Status: She is alert and oriented to person, place, and time.  Psychiatric:        Attention and Perception: Attention and perception normal.        Mood and Affect: Mood and affect normal.        Speech: Speech normal.        Behavior: Behavior normal. Behavior is cooperative.      UC Treatments / Results  Labs (all labs ordered are listed, but only abnormal results are displayed) Labs Reviewed - No data to display  EKG   Radiology DG Finger Index Right Result Date: 11/10/2023 CLINICAL DATA:  Status post trauma. EXAM: RIGHT INDEX FINGER 2+V COMPARISON:  None Available. FINDINGS: There is no evidence of fracture or dislocation. There is no evidence of arthropathy or other focal bone abnormality. Soft tissues are unremarkable. IMPRESSION: Negative. Electronically Signed   By: Suzen  Houston M.D.   On: 11/10/2023 19:06    Procedures Procedures (including critical care time)  Medications Ordered in UC Medications - No  data to display  Initial Impression / Assessment and Plan / UC Course  I have reviewed the triage vital signs and the nursing notes.  Pertinent labs & imaging results that were available during my care of the patient were reviewed by me and considered in my medical decision making (see chart for details).      Final Clinical Impressions(s) / UC Diagnoses   Final diagnoses:  Crushing injury of finger, right  Pain in finger of right hand   Patient presents today with concerns of an injury to the right index finger that occurred last week.  She reports that she slammed her finger in her car door and now reports some numbness and tingling along the proximal phalanx.  She is able to flex at the PIP and DIP joints but is unable to form a full flexion.  Cap refill is intact at the distal aspect of the right index finger.  I suspect that her range of motion may be reduced due to persistent mild swelling.  For now recommend conservative home measures such as OTC medications for pain, splinting, warm compresses as needed.  If symptoms or not improving over the next 1 to 2 weeks recommend follow-up with orthopedics and hand specialist for further evaluation. follow-up as needed    Discharge Instructions      You were seen today due to concerns of blunt trauma to your right index finger that occurred several days ago.  Your imaging was negative for signs of dislocation or fracture.  You seem to have largely intact range of motion so I am less suspicious for a soft tissue injury such as a tendon rupture.  For now I recommend continued use of over-the-counter medications as needed for symptomatic relief.  Please continue to stretch and use your hand as normal. If symptoms are not improving or worsening over the next week please follow up with Orthopedics so a hand specialist can look at your finger     ED Prescriptions   None    PDMP not reviewed this encounter.   Marylene Rocky FORBES DEVONNA 11/10/23  2041

## 2023-11-10 NOTE — ED Triage Notes (Signed)
 Pt presents with complaints of right index finger injury that occurred on Wednesday, 7/16. States she accidentally slammed the finger into a car door. Pt's main concern is the numbness and tingling in her right hand, radiating up the right arm. Unable to make a fist due to finger pain. Currently rates overall pain a 2/10. Finger splint and ice applied, alternating Tylenol  + Ibuprofen  with improvement.

## 2023-11-10 NOTE — Telephone Encounter (Signed)
 Contacted pt regarding appt scheduled for complaint below  Finger Injury - Slammed finger in car door a few days ago. Can not fully bend it. Slightly swollen and numb.  Advised that xray is not available at this location today and that we would be happy to see her but she would have to go somewhere to get xrays. She states she prefers to go to a different location that has xray available and was appreciative for the phone call.

## 2023-11-10 NOTE — Discharge Instructions (Addendum)
 You were seen today due to concerns of blunt trauma to your right index finger that occurred several days ago.  Your imaging was negative for signs of dislocation or fracture.  You seem to have largely intact range of motion so I am less suspicious for a soft tissue injury such as a tendon rupture.  For now I recommend continued use of over-the-counter medications as needed for symptomatic relief.  Please continue to stretch and use your hand as normal. If symptoms are not improving or worsening over the next week please follow up with Orthopedics so a hand specialist can look at your finger

## 2023-12-23 ENCOUNTER — Encounter: Payer: Self-pay | Admitting: Internal Medicine

## 2023-12-23 ENCOUNTER — Ambulatory Visit (INDEPENDENT_AMBULATORY_CARE_PROVIDER_SITE_OTHER): Admitting: Internal Medicine

## 2023-12-23 VITALS — BP 120/76 | HR 68 | Temp 98.3°F | Ht 70.0 in | Wt 237.6 lb

## 2023-12-23 DIAGNOSIS — G514 Facial myokymia: Secondary | ICD-10-CM | POA: Diagnosis not present

## 2023-12-23 DIAGNOSIS — Z566 Other physical and mental strain related to work: Secondary | ICD-10-CM | POA: Insufficient documentation

## 2023-12-23 DIAGNOSIS — E669 Obesity, unspecified: Secondary | ICD-10-CM | POA: Insufficient documentation

## 2023-12-23 DIAGNOSIS — F419 Anxiety disorder, unspecified: Secondary | ICD-10-CM | POA: Insufficient documentation

## 2023-12-23 DIAGNOSIS — R635 Abnormal weight gain: Secondary | ICD-10-CM | POA: Insufficient documentation

## 2023-12-23 LAB — COMPREHENSIVE METABOLIC PANEL WITH GFR
ALT: 22 U/L (ref 0–35)
AST: 22 U/L (ref 0–37)
Albumin: 4 g/dL (ref 3.5–5.2)
Alkaline Phosphatase: 75 U/L (ref 39–117)
BUN: 8 mg/dL (ref 6–23)
CO2: 24 meq/L (ref 19–32)
Calcium: 8.7 mg/dL (ref 8.4–10.5)
Chloride: 104 meq/L (ref 96–112)
Creatinine, Ser: 0.67 mg/dL (ref 0.40–1.20)
GFR: 110.66 mL/min (ref >60.00)
Glucose, Bld: 77 mg/dL (ref 70–99)
Potassium: 3.9 meq/L (ref 3.5–5.1)
Sodium: 140 meq/L (ref 135–145)
Total Bilirubin: 0.3 mg/dL (ref 0.2–1.2)
Total Protein: 7.4 g/dL (ref 6.0–8.3)

## 2023-12-23 LAB — CBC WITH DIFFERENTIAL/PLATELET
Basophils Absolute: 0 K/uL (ref 0.0–0.1)
Basophils Relative: 0.4 % (ref 0.0–3.0)
Eosinophils Absolute: 0.1 K/uL (ref 0.0–0.7)
Eosinophils Relative: 1.3 % (ref 0.0–5.0)
HCT: 37.4 % (ref 36.0–46.0)
Hemoglobin: 12.4 g/dL (ref 12.0–15.0)
Lymphocytes Relative: 41.2 % (ref 12.0–46.0)
Lymphs Abs: 3.1 K/uL (ref 0.7–4.0)
MCHC: 33.1 g/dL (ref 30.0–36.0)
MCV: 84.6 fl (ref 78.0–100.0)
Monocytes Absolute: 0.6 K/uL (ref 0.1–1.0)
Monocytes Relative: 7.4 % (ref 3.0–12.0)
Neutro Abs: 3.7 K/uL (ref 1.4–7.7)
Neutrophils Relative %: 49.7 % (ref 43.0–77.0)
Platelets: 300 K/uL (ref 150.0–400.0)
RBC: 4.43 Mil/uL (ref 3.87–5.11)
RDW: 14.5 % (ref 11.5–15.5)
WBC: 7.5 K/uL (ref 4.0–10.5)

## 2023-12-23 LAB — LIPID PANEL
Cholesterol: 150 mg/dL (ref 0–200)
HDL: 45.9 mg/dL (ref >39.00)
LDL Cholesterol: 91 mg/dL (ref 0–99)
NonHDL: 104.21
Total CHOL/HDL Ratio: 3
Triglycerides: 64 mg/dL (ref 0.0–149.0)
VLDL: 12.8 mg/dL (ref 0.0–40.0)

## 2023-12-23 LAB — HEMOGLOBIN A1C: Hgb A1c MFr Bld: 5.9 % (ref 4.6–6.5)

## 2023-12-23 MED ORDER — ESCITALOPRAM OXALATE 10 MG PO TABS: 10.0000 mg | ORAL_TABLET | Freq: Every day | ORAL | 1 refills | Status: DC

## 2023-12-23 MED ORDER — WEGOVY 0.25 MG/0.5ML ~~LOC~~ SOAJ: 0.2500 mg | SUBCUTANEOUS | 2 refills | Status: DC

## 2023-12-23 NOTE — Assessment & Plan Note (Addendum)
 Right upper eyelid myokymia   Twitching of the right upper eyelid is likely due to stress and screen time. The condition is benign and self-limiting. Consider vitamin deficiency. Advise daily prenatal vitamin supplementation and encourage stress reduction techniques and mindfulness practices. Reassure that the condition is benign and likely self-limiting.  Reviewed with patient management of similar situation per openevidence.  Young adult presenting with intermittent, unilateral twitching of the lateral right upper eyelid. No associated visual disturbance, ocular surface disease, or systemic symptoms. No history of excessive caffeine intake, medication triggers, or neurological findings. Symptoms are temporally associated with work-related stress. Examination reveals no evidence of blepharospasm, dystonia, or other facial movement disorder. No signs of dry eye or refractive error. 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, a self-limited condition that does not progress to other neurological disease and is not associated with vision loss.[1][2] There is no evidence of underlying dystonia or secondary causes such as drug-induced myokymia.[3]  Plan:  - Reassurance: Educate regarding the benign and self-limited nature of eyelid myokymia.[1][2]  - Lifestyle modification: Advise stress reduction strategies, including mindfulness-based practices, which may reduce symptom frequency and severity.[2]  - Caffeine: No need for restriction given minimal intake and lack of evidence for causality; moderate caffeine consumption may be protective against blepharospasm.[4]  - Prenatal vitamin: Recommend daily supplementation as requested, with attention to adequate B vitamin intake, as vitamin B12 deficiency has been associated with eyelid myokymia in rare cases.[3]  - Follow-up: No further investigation or neuroimaging is indicated unless symptoms persist, worsen, or new neurological signs  develop.[1][5]  - Patient education: Advise that most cases resolve spontaneously and do not require pharmacologic or procedural intervention. 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 No evidence of blepharospasm, dystonia, or underlying neurological disease. Conservative management and reassurance are appropriate. References Chronic Myokymia Limited to the Eyelid Is a Benign Condition. Butch SAUNDERS, Miller NR. Journal of Neuro-Ophthalmology : The Official Journal of the [employer]. 2004;24(4):290-2. doi:10.1097/00041327-200412000-00003. Will Tonic Water Stop My Eyelid Twitching?SABRA Moshirfar M, Somani SN, Shmunes KM, Ronquillo YC. Clinical Ophthalmology [facility] [place] [id number]. doi:10.2147/OPTH.D764104. Metformin-Induced Eyelid Myokymia. Ahsan M, Nizami DJ. [place] Journal of Pharmacology. 2024;56(5):358-360. doi:10.4103/ijp.ijp_284_24. Environmental Risk Factors and Clinical Phenotype in Familial and Sporadic Primary Blepharospasm. Defazio G, Abbruzzese G, Aniello MS, et al. Neurology. 2011;77(7):631-7. doi:10.1212/WNL.[id number] e13. Primary Blepharospasm: Diagnosis and Management. Defazio G, Livrea P. Drugs. 2004;64(3):237-44. doi:10.2165/[id number].

## 2023-12-23 NOTE — Patient Instructions (Addendum)
 It was a pleasure seeing you today! Your health and satisfaction are our top priorities.  Bernardino Cone, MD  VISIT SUMMARY: Today, we discussed your concerns about weight gain and work-related stress. You have started high-intensity dance classes and reduced sugary drinks to manage your weight. We also addressed your work-related stress and the new symptom of right eye twitching.  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 We will start you on Wegovy  0.25 mg weekly, which may help with weight loss. You should also reduce carbohydrate intake, avoid sugary drinks, and incorporate muscle-building exercises like using a TRX machine. Continue with your fitness classes. We will follow up in one month to reassess your progress and adjust the medication if necessary.  -WORK-RELATED STRESS AND ANXIETY SYMPTOMS: Your work-related stress is causing difficulty focusing and increased anxiety, along with right eye twitching. This may be due to a vitamin deficiency. We recommend mindfulness practices and stress reduction techniques. You should take a daily prenatal vitamin and continue your daily coffee intake, as moderate caffeine may be beneficial. Follow the 20-20-20 rule for screen time: every 20 minutes, take a 20-second break and look at something 20 feet away.  -RIGHT UPPER EYELID MYOKYMIA: The twitching of your right upper eyelid is likely due to stress and screen time. This condition is benign and usually goes away on its own. It may also be related to a vitamin deficiency. Take a daily prenatal vitamin and practice stress reduction techniques and mindfulness. Rest assured, this condition is not serious and should resolve on its own.  INSTRUCTIONS: Please follow up in one month to reassess your progress with weight management and to adjust your medication if necessary.  Your Providers PCP: Cone Bernardino MATSU, MD,   [phone]) Referring Provider: de [place], Raymond J, MD,  [phone])  NEXT STEPS: [x]  Early Intervention: Schedule sooner appointment, call our on-call services, or go to emergency room if there is any significant Increase in pain or discomfort New or worsening symptoms Sudden or severe changes in your health [x]  Flexible Follow-Up: We recommend a Return in about 1 month (around 01/22/2024). for optimal routine care. This allows for progress monitoring and treatment adjustments. [x]  Preventive Care: Schedule your annual preventive care visit! It's typically covered by insurance and helps identify potential health issues early. [x]  Lab & X-ray Appointments: Incomplete tests scheduled today, or call to schedule. X-rays: [facility] (M-F, 8:30am-noon or 1pm-5pm). [x]  Medical Information Release: Sign a release form at front desk to obtain relevant medical information we don't have.  MAKING THE MOST OF OUR FOCUSED 20 MINUTE APPOINTMENTS: [x]   Clearly state your top concerns at the beginning of the visit to focus our discussion [x]   If you anticipate you will need more time, please inform the front desk during scheduling - we can book multiple appointments in the same week. [x]   If you have transportation problems- use our convenient video appointments or ask about transportation support. [x]   We can get down to business faster if you use MyChart to update information before the visit and submit non-urgent questions before your visit. Thank you for taking the time to provide details through MyChart.  Let our nurse know and she can import this information into your encounter documents.  Arrival and Wait Times: [x]   Arriving on time ensures that everyone receives prompt attention. [x]   Early morning (8a) and afternoon (1p) appointments tend to have shortest  wait times. [x]   Unfortunately, we cannot delay appointments for late arrivals or hold slots during phone calls.  Getting  Answers and Following Up [x]   Simple Questions & Concerns: For quick questions or basic follow-up after your visit, reach us  at [phone] or MyChart messaging. [x]   Complex Concerns: If your concern is more complex, scheduling an appointment might be best. Discuss this with the staff to find the most suitable option. [x]   Lab & Imaging Results: We'll contact you directly if results are abnormal or you don't use MyChart. Most normal results will be on MyChart within 2-3 business days, with a review message from Dr. Jesus. Haven't heard back in 2 weeks? Need results sooner? Contact us  at [phone]. [x]   Referrals: Our referral coordinator will manage specialist referrals. The specialist's office should contact you within 2 weeks to schedule an appointment. Call us  if you haven't heard from them after 2 weeks.  Staying Connected [x]   MyChart: Activate your MyChart for the fastest way to access results and message us . See the last page of this paperwork for instructions on how to activate.  Bring to Your Next Appointment [x]   Medications: Please bring all your medication bottles to your next appointment to ensure we have an accurate record of your prescriptions. [x]   Health Diaries: If you're monitoring any health conditions at home, keeping a diary of your readings can be very helpful for discussions at your next appointment.  Billing [x]   X-ray & Lab Orders: These are billed by separate companies. Contact the invoicing company directly for questions or concerns. [x]   Visit Charges: Discuss any billing inquiries with our administrative services team.  Your Satisfaction Matters [x]   Share Your Experience: We strive for your satisfaction! If you have any complaints, or preferably compliments, please let Dr. Jesus know directly or contact our Practice Administrators, Manuelita Rubin or [employer], by asking at the front desk.   Reviewing Your Records [x]   Review this early draft of  your clinical encounter notes below and the final encounter summary tomorrow on MyChart after its been completed.  All orders placed so far are visible here: Weight gain -     Wegovy ; Inject 0.25 mg into the skin once a week.  Dispense: 2 mL; Refill: 2  Anxiety -     Escitalopram  Oxalate; Take 1 tablet (10 mg total) by mouth daily. Start at half tablet daily.  Dispense: 30 tablet; Refill: 1 -     TSH Rfx on Abnormal to Free T4  Work stress -     Escitalopram  Oxalate; Take 1 tablet (10 mg total) by mouth daily. Start at half tablet daily.  Dispense: 30 tablet; Refill: 1  Eyelid myokymia Assessment & Plan: Young adult presenting with intermittent, unilateral twitching of the lateral right upper eyelid. No associated visual disturbance, ocular surface disease, or systemic symptoms. No history of excessive caffeine intake, medication triggers, or neurological findings. Symptoms are temporally associated with work-related stress. Examination reveals no evidence of blepharospasm, dystonia, or other facial movement disorder. No signs of dry eye or refractive error. 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, a self-limited condition that does not progress to other neurological disease and is not associated with vision loss.[1][2] There is no evidence of underlying dystonia or secondary causes such as drug-induced myokymia.[3]  Plan:  - Reassurance: Educate regarding the benign and self-limited nature of eyelid myokymia.[1][2]  - Lifestyle modification: Advise stress reduction strategies, including mindfulness-based practices, which may reduce symptom  frequency and severity.[2]  - Caffeine: No need for restriction given minimal intake and lack of evidence for causality; moderate caffeine consumption may be protective against blepharospasm.[4]  - Prenatal vitamin: Recommend daily supplementation as requested, with attention to adequate B vitamin intake, as vitamin B12 deficiency  has been associated with eyelid myokymia in rare cases.[3]  - Follow-up: No further investigation or neuroimaging is indicated unless symptoms persist, worsen, or new neurological signs develop.[1][5]  - Patient education: Advise that most cases resolve spontaneously and do not require pharmacologic or procedural intervention. 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 No evidence of blepharospasm, dystonia, or underlying neurological disease. Conservative management and reassurance are appropriate. References Chronic Myokymia Limited to the Eyelid Is a Benign Condition. Butch SAUNDERS, Miller NR. Journal of Neuro-Ophthalmology : The Official Journal of the [employer]. 2004;24(4):290-2. doi:10.1097/[id number]. Will Tonic Water Stop My Eyelid Twitching?SABRA Moshirfar M, Somani SN, Shmunes KM, Ronquillo YC. Clinical Ophthalmology [facility] [place] [phone]. doi:10.2147/[id number]. Metformin-Induced Eyelid Myokymia. Ahsan M, Nizami DJ. [place] Journal of Pharmacology. 2024;56(5):358-360. doi:10.4103/ijp.[id number]_[id number]_24. Environmental Risk Factors and Clinical Phenotype in Familial and Sporadic Primary Blepharospasm. Defazio G, Abbruzzese G, Aniello MS, et al. Neurology. 2011;77(7):631-7. doi:10.1212/[id number] e13. Primary Blepharospasm: Diagnosis and Management. Defazio G, Livrea P. Drugs. 2004;64(3):237-44. doi:10.2165/[id number].  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  Dispense: 2 mL; Refill: 2 -     CBC with Differential/Platelet -     Comprehensive metabolic panel with GFR -     Lipid panel -     Hemoglobin A1c -     TSH Rfx on Abnormal to Free T4

## 2023-12-23 NOTE — Assessment & Plan Note (Signed)
 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 She is motivated to manage her weight and improve her health. Start Wegovy  0.25 mg weekly, with potential dose escalation to minimize side effects like nausea. Advise lifestyle modifications: reduce carbohydrate intake, avoid sugary drinks, and incorporate muscle-building exercises. Recommend using a TRX machine for strength training and encourage continuation of fitness classes. Schedule a follow-up in one month to reassess progress and adjust medication if necessary.

## 2023-12-23 NOTE — Assessment & Plan Note (Signed)
 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 Consider vitamin deficiency. Recommend mindfulness practices and stress reduction techniques. Advise daily prenatal vitamin supplementation and encourage daily coffee intake as moderate caffeine intake may be protective. Discuss the 20-20-20 rule for screen time management.

## 2023-12-23 NOTE — Progress Notes (Signed)
 [facility]  Phone: [phone]  - Medical Office Visit -  Visit Date: 12/23/2023 Patient: Caitlin Patrick   DOB: October 03, 1985   38 y.o. Female  MRN: [id number] Patient Care Team: Jesus Bernardino MATSU, MD as PCP - General (Internal Medicine) Today's Health Care Provider: Bernardino MATSU Jesus, MD  ===========================================   Chief Complaint / Reason for Visit: New Pt  (Having trouble just sitting and concentrating at long period of time. 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  Discussed the use of AI scribe software for clinical note transcription with the patient, who gave verbal consent to proceed.  History of Present Illness 38 year old female who presents with concerns about weight gain and work-related stress.  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 She attributes the weight gain to decreased activity and less conscious eating habits. To address this, she has started taking high-intensity dance classes and has cut back on sugary drinks.  Her current exercise routine includes high-intensity dance classes, which she finds beneficial for cardiovascular fitness without joint strain.  She experiences work-related stress, contributing to anxiety and difficulty concentrating. She has a new symptom of right  eye twitching, which she associates with screen time and stress. Her caffeine intake is moderate. No dry or watery eyes. The twitching is localized to the lateral right upper eyelid and is not associated with any visual disturbances. No tics or other involuntary movements.   Allergies:  Patient has no known allergies. Past Medical History:  has a past medical history of DVT (deep venous thrombosis) (HCC), Sickle cell trait (HCC), Uterine leiomyoma (04/29/2022), and Vaginal Pap smear, abnormal. Past Surgical History:   has a past surgical history that includes Wisdom tooth extraction; Multiple tooth extractions (Left); Cesarean section; and Carpal tunnel release (Right, 05/01/2021). Social History:   reports that she has quit smoking. She has been exposed to tobacco smoke. She has never used smokeless tobacco. She reports that she does not currently use alcohol. She reports that she does not currently use drugs after having used the following drugs: Marijuana. Family History:  family history includes Breast cancer in her mother; Cancer in her mother; Diabetes in her maternal grandmother and paternal grandmother; Hypertension in her father. Depression Screen and Health Maintenance:    12/23/2023   10:29 AM 06/25/2023    8:10 AM 02/24/2023    9:56 AM 12/09/2022   10:00 AM  PHQ 2/9 Scores  PHQ - 2 Score 0 0 0 0  PHQ- 9 Score  0 0 8   Health Maintenance  Topic Date Due   Hepatitis B Vaccines 19-59 Average Risk (1 of 3 - 19+ 3-dose series) Never done   HPV VACCINES (1 - 3-dose SCDM series) Never done   INFLUENZA VACCINE  11/21/2023   COVID-19 Vaccine (1 - 2024-25 season) Never done   Cervical Cancer Screening (HPV/Pap Cotest)  02/06/2025   DTaP/Tdap/Td (2 -  Td or Tdap) 02/23/2033   Hepatitis C Screening  Completed   HIV Screening  Completed   Pneumococcal Vaccine  Aged Out   Meningococcal B Vaccine  Aged Out   Immunization History  Administered Date(s) Administered   Influenza, Seasonal,  Injecte, Preservative Fre 04/02/2004   Tdap 02/24/2023     Objective   Physical ExamBP 120/76   Pulse 68   Temp 98.3 F (36.8 C) (Temporal)   Ht 5' 10 (1.778 m)   Wt 237 lb 9.6 oz (107.8 kg)   LMP 12/06/2023 (Approximate)   SpO2 99%   BMI 34.09 kg/m  Wt Readings from Last 10 Encounters:  12/23/23 237 lb 9.6 oz (107.8 kg)  11/10/23 230 lb (104.3 kg)  06/25/23 224 lb 12.8 oz (102 kg)  04/21/23 214 lb 4.8 oz (97.2 kg)  02/24/23 216 lb 8 oz (98.2 kg)  12/09/22 216 lb 3.2 oz (98.1 kg)  10/07/22 215 lb (97.5 kg)  09/11/22 215 lb (97.5 kg)  07/08/22 215 lb 3.2 oz (97.6 kg)  04/29/22 214 lb 3.2 oz (97.2 kg)  Vital signs reviewed.  Nursing notes reviewed. Weight trend reviewed. Abnormalities and problem-specific physical exam findings:  truncal adiposity moderate circumference at umbilicus 41 General Appearance:  Well developed, well nourished, well-groomed, healthy-appearing female with Body mass index is 34.09 kg/m. No acute distress appreciable.   Skin: Clear and well-hydrated. Pulmonary:  Normal work of breathing at rest, no respiratory distress apparent. SpO2: 99 %  Musculoskeletal: She demonstrates smooth and coordinated movements throughout all major joints.All extremities are intact.  Neurological:  Awake, alert, oriented, and engaged.  No obvious focal neurological deficits or cognitive impairments.  Sensorium seems unclouded.  Psychiatric:  Appropriate mood, pleasant and cooperative demeanor, cheerful and engaged during the exam  Reviewed Results & Data Results     No results found for any visits on 12/23/23.  Orders Only on 06/25/2023  Component Date Value   Total Protein 06/25/2023 7.0    Albumin 06/25/2023 4.0    Bilirubin Total 06/25/2023 0.2    Bilirubin, Direct 06/25/2023 0.10    Alkaline Phosphatase 06/25/2023 99    AST 06/25/2023 17    ALT 06/25/2023 14   Orders Only on 02/10/2023  Component Date Value   WBC 02/10/2023 7.3    RBC 02/10/2023 4.00     Hemoglobin 02/10/2023 11.4    Hematocrit 02/10/2023 34.8    MCV 02/10/2023 87    MCH 02/10/2023 28.5    MCHC 02/10/2023 32.8    RDW 02/10/2023 13.7    Platelets 02/10/2023 308    Neutrophils 02/10/2023 48    Lymphs 02/10/2023 43    Monocytes 02/10/2023 7    Eos 02/10/2023 2    Basos 02/10/2023 0    Neutrophils Absolute 02/10/2023 3.5    Lymphocytes Absolute 02/10/2023 3.1    Monocytes Absolute 02/10/2023 0.5    EOS (ABSOLUTE) 02/10/2023 0.1    Basophils Absolute 02/10/2023 0.0    Immature Granulocytes 02/10/2023 0    Immature Grans (Abs) 02/10/2023 0.0    Glucose 02/10/2023 74    BUN 02/10/2023 10    Creatinine, Ser 02/10/2023 0.76    eGFR 02/10/2023 103    BUN/Creatinine Ratio 02/10/2023 13    Sodium 02/10/2023 142    Potassium 02/10/2023 4.2    Chloride 02/10/2023 104    CO2 02/10/2023 21    Calcium 02/10/2023 9.8    Total Protein 02/10/2023 7.1    Albumin 02/10/2023 4.2    Globulin, Total  02/10/2023 2.9    Bilirubin Total 02/10/2023 <0.2    Alkaline Phosphatase 02/10/2023 110    AST 02/10/2023 33    ALT 02/10/2023 57 (H)    Cholesterol, Total 02/10/2023 163    Triglycerides 02/10/2023 92    HDL 02/10/2023 50    VLDL Cholesterol Cal 02/10/2023 17    LDL Chol Calc (NIH) 02/10/2023 96    Chol/HDL Ratio 02/10/2023 3.3    Hgb A1c MFr Bld 02/10/2023 5.4    Est. average glucose Bld* 02/10/2023 108    TSH 02/10/2023 1.310    No image results found.   DG Finger Index Right Result Date: 11/10/2023 CLINICAL DATA:  Status post trauma. EXAM: RIGHT INDEX FINGER 2+V COMPARISON:  None Available. FINDINGS: There is no evidence of fracture or dislocation. There is no evidence of arthropathy or other focal bone abnormality. Soft tissues are unremarkable. IMPRESSION: Negative. Electronically Signed   By: Suzen Dials M.D.   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 Consider vitamin deficiency. Recommend mindfulness practices and stress reduction techniques. Advise daily prenatal vitamin supplementation and encourage daily coffee intake as moderate caffeine intake may be protective. Discuss the 20-20-20 rule for screen time management. Eyelid myokymia Right upper eyelid myokymia   Twitching of the right upper eyelid is likely due to stress and screen time. The condition is benign and self-limiting. Consider vitamin deficiency. Advise daily prenatal vitamin supplementation and encourage stress reduction techniques and mindfulness practices. Reassure that the condition is benign and likely self-limiting.  Reviewed with patient management of similar situation per openevidence.  Young adult presenting with intermittent, unilateral twitching of the lateral right upper eyelid. No associated visual disturbance, ocular surface disease, or systemic symptoms. No history of excessive caffeine intake, medication triggers, or neurological findings. Symptoms are temporally associated with work-related stress. Examination reveals no evidence of blepharospasm, dystonia, or other facial movement disorder. No signs of dry eye or refractive error. 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, a self-limited condition that does not progress to other neurological disease and is not associated with vision loss.[1][2] There is no evidence of underlying dystonia or secondary causes such as drug-induced myokymia.[3]  Plan:  - Reassurance: Educate regarding the benign and self-limited nature of eyelid myokymia.[1][2]  - Lifestyle modification: Advise stress reduction strategies, including mindfulness-based practices, which may reduce symptom frequency and severity.[2]  - Caffeine: No need for restriction given minimal intake and lack of evidence for  causality; moderate caffeine consumption may be protective against blepharospasm.[4]  - Prenatal vitamin: Recommend daily supplementation as requested, with attention to adequate B vitamin intake, as vitamin B12 deficiency has been associated with eyelid myokymia in rare cases.[3]  - Follow-up: No further investigation or neuroimaging is indicated unless symptoms persist, worsen, or new neurological signs develop.[1][5]  - Patient education: Advise that most cases resolve spontaneously and do not require pharmacologic or procedural intervention. 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 No evidence of blepharospasm, dystonia, or underlying neurological disease. Conservative management and reassurance are appropriate. References Chronic Myokymia Limited to the Eyelid Is a Benign Condition. Butch SAUNDERS, Miller NR. Journal of Neuro-Ophthalmology : The Official Journal of the  [employer]. 2004;24(4):290-2. doi:10.1097/[id number]. Will Tonic Water Stop My Eyelid Twitching?SABRA Moshirfar M, Somani SN, Shmunes KM, Ronquillo YC. Clinical Ophthalmology [facility] [phone]. doi:10.2147/OPTH.[id number]. Metformin-Induced Eyelid Myokymia. Ahsan M, Nizami DJ. [place] Journal of Pharmacology. 2024;56(5):358-360. doi:10.4103/ijp.ijp_284_24. Environmental Risk Factors and Clinical Phenotype in Familial and Sporadic Primary Blepharospasm. Defazio G, Abbruzzese G, Aniello MS, et al. Neurology. 2011;77(7):631-7. doi:10.1212/WNL.[id number] e13. Primary Blepharospasm: Diagnosis and Management. Defazio G, Livrea P. Drugs. 2004;64(3):237-44. doi:10.2165/[id number].  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 She is motivated to manage her weight  and improve her health. Start Wegovy  0.25 mg weekly, with potential dose escalation to minimize side effects like nausea. Advise lifestyle modifications: reduce carbohydrate intake, avoid sugary drinks, and incorporate muscle-building exercises. Recommend using a TRX machine for strength training and encourage continuation of fitness classes. Schedule a follow-up in one month to reassess progress and adjust medication if necessary.     ICD-10-CM   1. Weight gain  R63.5 semaglutide -weight management (WEGOVY ) 0.25 MG/0.5ML SOAJ SQ injection    2. Anxiety  F41.9 escitalopram  (LEXAPRO ) 10 MG tablet    TSH Rfx on Abnormal to Free T4    3. Work stress  Z56.6 escitalopram  (LEXAPRO ) 10 MG tablet    4. Eyelid myokymia  G51.4 CBC with Differential/Platelet    Comprehensive metabolic panel with GFR    5. 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 Future Appointments  Date Time Provider Department Center  01/26/2024  3:20 PM Jesus Bernardino MATSU, MD [facility]  02/25/2024  1:10 PM de [place], Raymond J, MD [facility] [phone] [facility]        Additional notes: This document was synthesized by artificial intelligence (Abridge) using HIPAA-compliant recording of the clinical interaction;   We discussed the use of AI scribe software for clinical note transcription with the patient, who gave verbal consent to proceed.    Additional Info: This encounter employed state-of-the-art, real-time, collaborative documentation. The patient actively reviewed and assisted in updating their electronic medical record on a shared screen, ensuring transparency and facilitating joint problem-solving for the problem list, overview, and plan. This  approach promotes accurate, informed care. The treatment plan was discussed and reviewed in detail, including medication safety, potential side effects, and all patient questions. We confirmed understanding and comfort with the plan. Follow-up instructions were established, including contacting the office for any concerns, returning if symptoms worsen, persist, or new symptoms develop, and precautions for potential emergency department visits.  Initial Appointment Goals:  This initial visit focused on establishing a foundation for the patient's care. We collaboratively reviewed her medical history and medications in detail, updating the chart as shown in the encounter. Given the extensive information, we prioritized addressing her most pressing concerns, which she reported were: New Pt  (Having trouble just sitting and concentrating at long period of time. 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 To expedite  a comprehensive plan at the next visit, we encouraged the patient to gather relevant medical records from previous providers. This collaborative approach will ensure a more complete understanding of the patient's health and inform the development of a personalized care plan. We look forward to continuing the conversation and working together with the patient on achieving her health goals.   Collaborative Documentation:  Today's encounter utilized real-time, dynamic patient engagement.  Patients actively participate by directly reviewing and assisting in updating their medical records through a shared screen. This transparency empowers patients to visually confirm chart updates made by the healthcare provider.  This collaborative approach facilitates problem management as we jointly update the  problem list, problem overview, and assessment/plan. Ultimately, this process enhances chart accuracy and completeness, fostering shared decision-making, patient education, and informed consent for tests and treatments.  Collaborative Treatment Planning:  Treatment plans were discussed and reviewed in detail.  Explained medication safety and potential side effects.  Encouraged participation and answered all patient questions, confirming understanding and comfort with the plan. Encouraged patient to contact our office if they have any questions or concerns. Agreed on patient returning to office if symptoms worsen, persist, or new symptoms develop.  ----------------------------------------------------- Bernardino KANDICE Cone, MD  12/23/2023 12:50 PM  [facility]  [phone]

## 2023-12-24 ENCOUNTER — Telehealth: Payer: Self-pay

## 2023-12-24 ENCOUNTER — Other Ambulatory Visit (HOSPITAL_COMMUNITY): Payer: Self-pay

## 2023-12-24 LAB — TSH RFX ON ABNORMAL TO FREE T4: TSH: 1.82 u[IU]/mL (ref 0.450–4.500)

## 2023-12-24 NOTE — Telephone Encounter (Addendum)
 Pharmacy Patient Advocate Encounter   Received notification from Pt Calls Messages that prior authorization for Wegovy  0.25MG /0.5ML auto-injectors is required/requested.   Insurance verification completed.   The patient is insured through [facility] .   Per test claim: PA required; PA submitted to above mentioned insurance via Latent Key/confirmation #/[id number] Status is pending

## 2023-12-24 NOTE — Telephone Encounter (Signed)
 Pharmacy Patient Advocate Encounter  Received notification from [employer] that Prior Authorization for Wegovy  auto-injectors has been DENIED.  Full denial letter will be uploaded to the media tab. See denial reason below.   PA #/Case ID/Reference #: [id number]     -The documentation on the office visit from 12/23/23 is not sufficient.

## 2023-12-24 NOTE — Telephone Encounter (Signed)
 Pt called in to make sure that the PA is going through her [employer]. She would like to pick up medication at the [facility] [address] [place] - [address] [phone]

## 2023-12-28 ENCOUNTER — Ambulatory Visit: Payer: Self-pay | Admitting: Internal Medicine

## 2023-12-28 DIAGNOSIS — R7303 Prediabetes: Secondary | ICD-10-CM | POA: Insufficient documentation

## 2023-12-29 NOTE — Telephone Encounter (Signed)
 read by Rolin GORMAN Mote at 8:04AM on 12/29/2023.

## 2024-01-26 ENCOUNTER — Ambulatory Visit: Admitting: Internal Medicine

## 2024-02-01 ENCOUNTER — Other Ambulatory Visit: Payer: Self-pay | Admitting: Medical Genetics

## 2024-02-01 DIAGNOSIS — Z006 Encounter for examination for normal comparison and control in clinical research program: Secondary | ICD-10-CM

## 2024-02-06 ENCOUNTER — Ambulatory Visit (INDEPENDENT_AMBULATORY_CARE_PROVIDER_SITE_OTHER): Admitting: Internal Medicine

## 2024-02-06 VITALS — BP 120/80 | HR 90 | Temp 97.7°F | Ht 70.0 in | Wt 231.0 lb

## 2024-02-06 DIAGNOSIS — R7303 Prediabetes: Secondary | ICD-10-CM

## 2024-02-06 DIAGNOSIS — K760 Fatty (change of) liver, not elsewhere classified: Secondary | ICD-10-CM

## 2024-02-06 DIAGNOSIS — E669 Obesity, unspecified: Secondary | ICD-10-CM | POA: Diagnosis not present

## 2024-02-06 DIAGNOSIS — F419 Anxiety disorder, unspecified: Secondary | ICD-10-CM

## 2024-02-06 MED ORDER — WEGOVY 0.25 MG/0.5ML ~~LOC~~ SOAJ: 0.2500 mg | SUBCUTANEOUS | 2 refills | Status: DC

## 2024-02-06 MED ORDER — BUSPIRONE HCL 5 MG PO TABS: 5.0000 mg | ORAL_TABLET | Freq: Two times a day (BID) | ORAL | 3 refills | Status: AC

## 2024-02-06 MED ORDER — METFORMIN HCL 500 MG PO TABS: 500.0000 mg | ORAL_TABLET | Freq: Two times a day (BID) | ORAL | 3 refills | Status: AC

## 2024-02-06 NOTE — Progress Notes (Unsigned)
 ==============================  [facility]: [phone]   -- Medical Office Visit --  Patient: Caitlin Patrick      Age: 38 y.o.       Sex:  female  Date:   02/06/2024 Today's Healthcare Provider: Bernardino Patrick [facility], Patrick  ==============================   Chief Complaint: Follow-up (Was supposed to be on Wegovy  but medicaid didn't approve it so has not been on medication. Wants to discuss mMyChart message about being pre diabetic. Anxiety medication made her feel super weird. )   Discussed the use of AI scribe software for clinical note transcription with the patient, who gave verbal consent to proceed.  History of Present Illness Caitlin Patrick is a 38 year old female with prediabetes who presents for evaluation of weight management options.  She has a BMI of 30 and has struggled with weight loss despite lifestyle modifications, including dietary changes and increased physical activity. She has not yet consulted a dietitian or participated in structured weight loss programs but is open to these options. She is transitioning from Medicaid to [employer] to explore coverage for weight management medications.  There is a family history of diabetes, and recent lab work indicated elevated liver enzymes, which may suggest non-alcoholic fatty liver disease. She has not undergone sleep testing and does not have confirmed sleep apnea or diabetes.  She experiences anxiety and previously discontinued a medication due to side effects of nausea and feeling 'weird'.  Lab Results  Component Value Date   HGBA1C 5.9 12/23/2023   HGBA1C 5.4 02/10/2023   HGBA1C 5.5 04/23/2013    Wt Readings from Last 50 Encounters:  02/06/24 231 lb (104.8 kg)  12/23/23 237 lb 9.6 oz (107.8 kg)  11/10/23 230 lb (104.3 kg)  06/25/23 224 lb 12.8 oz (102 kg)  04/21/23 214 lb 4.8 oz (97.2 kg)  02/24/23 216 lb 8 oz (98.2 kg)  12/09/22 216 lb 3.2 oz (98.1 kg)  10/07/22 215  lb (97.5 kg)  09/11/22 215 lb (97.5 kg)  07/08/22 215 lb 3.2 oz (97.6 kg)  04/29/22 214 lb 3.2 oz (97.2 kg)  08/01/21 220 lb (99.8 kg)  05/01/21 205 lb 4 oz (93.1 kg)  02/07/20 206 lb (93.4 kg)  12/14/18 209 lb (94.8 kg)  08/15/17 190 lb (86.2 kg)  01/06/17 202 lb 12.8 oz (92 kg)  11/28/16 195 lb (88.5 kg)  10/01/16 191 lb (86.6 kg)  09/21/16 188 lb 3 oz (85.4 kg)  04/23/13 220 lb (99.8 kg)   BMI Readings from Last 50 Encounters:  02/06/24 33.15 kg/m  12/23/23 34.09 kg/m  11/10/23 33.00 kg/m  06/25/23 32.26 kg/m  04/21/23 30.75 kg/m  02/24/23 31.06 kg/m  12/09/22 31.02 kg/m  10/07/22 30.85 kg/m  09/11/22 30.85 kg/m  07/08/22 30.88 kg/m  04/29/22 30.73 kg/m  08/01/21 30.68 kg/m  05/01/21 28.63 kg/m  02/07/20 29.56 kg/m  12/14/18 30.86 kg/m  08/15/17 27.26 kg/m  01/06/17 29.10 kg/m  11/28/16 27.98 kg/m  10/01/16 27.41 kg/m  09/21/16 27.00 kg/m  04/23/13 31.57 kg/m      Patient indicates they have NOT had  history of medullary thyroid carcinoma, multiple endocrine neoplasia)  {No specialty comments available.:1 Problem List as of 02/06/2024 Reviewed: 12/23/2023 12:44 PM by Caitlin Patrick    Alternating constipation and diarrhea   Last Assessment & Plan 02/24/2023 Office Visit Written 02/24/2023 10:17 AM by Caitlin Patrick  Patient reports chronic intermittent issues with constipation and diarrhea.  She  has not had prior evaluation with GI.  Requesting referral today, feel that this is reasonable, referral placed      Anemia   Last Assessment & Plan 10/07/2022 Office Visit Written 10/10/2022  4:41 PM by Caitlin Patrick  Mild anemia observed on labs in the emergency department.  Given that she is currently taking blood thinner and insetting of menstrual period, we will assess for any worsening of anemia, labs to be completed today.  We will also assess iron storage      Anxiety   BMI 31.0-31.9,adult   Chronic pelvic pain in  female   DVT (deep venous thrombosis) [facility])   Last Assessment & Plan 12/09/2022 Office Visit Written 12/09/2022 10:16 AM by Caitlin Patrick  Patient continues with Xarelto , feels that she has been doing well, [place] about 1 week remaining for this medication.  She did have some calf soreness/occasional swelling of left lower extremity since last time she was in the office, however reports that this has been improving, no issues currently with pain or swelling.  She has not had any other bleeding concerns.  She is looking into establishing with an OB/GYN locally, wants to get set up with OB/GYN here at [facility]. On exam, patient is in no acute distress, vital signs stable.  Bilateral lower extremities with no edema present currently, no tenderness to palpation to the posterior calf. We again reviewed that her DVT was felt to be provoked and as such, 3 months of anticoagulant use is typically advised which patient will be completing here shortly.  We would not necessarily need to proceed with repeat imaging given that patient has been doing much better symptomatically.  Did discuss that if any symptoms do recur, to let us  know and would certainly be reasonable to proceed with updated imaging at that time to assess for DVT recurrence. Recommend that she established with OB/GYN, if they are not taking patients here, we can provide referral to alternative office locally to assist in establishing care, she will let us  know if she needs assistance.      Eyelid myokymia   Last Assessment & Plan 12/23/2023 Office Visit Edited 12/23/2023 12:52 PM by Caitlin Patrick  Right upper eyelid myokymia   Twitching of the right upper eyelid is likely due to stress and screen time. The condition is benign and self-limiting. Consider vitamin deficiency. Advise daily prenatal vitamin supplementation and encourage stress reduction techniques and mindfulness practices. Reassure that the condition is benign and likely  self-limiting.  Reviewed with patient management of similar situation per openevidence.  Young adult presenting with intermittent, unilateral twitching of the lateral right upper eyelid. No associated visual disturbance, ocular surface disease, or systemic symptoms. No history of excessive caffeine intake, medication triggers, or neurological findings. Symptoms are temporally associated with work-related stress. Examination reveals no evidence of blepharospasm, dystonia, or other facial movement disorder. No signs of dry eye or refractive error. 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, a self-limited condition that does not progress to other neurological disease and is not associated with vision loss.[1][2] There is no evidence of underlying dystonia or secondary causes such as drug-induced myokymia.[3]  Plan:  - Reassurance: Educate regarding the benign and self-limited nature of eyelid myokymia.[1][2]  - Lifestyle modification: Advise stress reduction strategies, including mindfulness-based practices, which may reduce symptom frequency and severity.[2]  - Caffeine: No need for restriction given minimal intake and lack of evidence for causality; moderate caffeine  consumption may be protective against blepharospasm.[4]  - Prenatal vitamin: Recommend daily supplementation as requested, with attention to adequate B vitamin intake, as vitamin B12 deficiency has been associated with eyelid myokymia in rare cases.[3]  - Follow-up: No further investigation or neuroimaging is indicated unless symptoms persist, worsen, or new neurological signs develop.[1][5]  - Patient education: Advise that most cases resolve spontaneously and do not require pharmacologic or procedural intervention. 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 No evidence of  blepharospasm, dystonia, or underlying neurological disease. Conservative management and reassurance are appropriate. References Chronic Myokymia Limited to the Eyelid Is a Benign Condition. Butch SAUNDERS, Miller NR. Journal of Neuro-Ophthalmology : The Official Journal of the [employer]. 2004;24(4):290-2. doi:10.1097/[id number]. Will Tonic Water Stop My Eyelid Twitching?SABRA Moshirfar M, Somani SN, Shmunes KM, Ronquillo YC. Clinical Ophthalmology [facility] [phone]. doi:10.2147/[id number]. Metformin-Induced Eyelid Myokymia. Ahsan M, Nizami DJ. [place] Journal of Pharmacology. 2024;56(5):358-360. doi:10.4103/[id number]_24. Environmental Risk Factors and Clinical Phenotype in Familial and Sporadic Primary Blepharospasm. Defazio G, Abbruzzese G, Aniello MS, et al. Neurology. 2011;77(7):631-7. doi:10.1212/[id number] e13. Primary Blepharospasm: Diagnosis and Management. Defazio G, Livrea P. Drugs. 2004;64(3):237-44. doi:10.2165/[id number].       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  With this job, she has noticed increased left leg symptoms with some associated knee discomfort, stiffness.  Has also had some discomfort through her left calf.  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  She does feel that she has had slight swelling in the left leg as compared to right, indicating that she may notice indentation from her sock at the end of the day.  No issues with shortness of breath or trouble breathing. On exam, patient is in no acute distress, vital signs stable.  Left leg with no tenderness to palpation through calf posteriorly.  No tenderness to palpation about the knee, normal range of motion with left  knee.  No edema noted in the left lower extremity.  Calf circumference equal bilaterally. Discussed that it is less likely that she has current issue with DVT in left lower extremity.  Discussed consideration is related to associated knee pain, dependent swelling towards the end of the day.  Discussed possibility for some underlying vein injury from prior DVT which could contribute to slight increase in swelling as compared to right leg.  Could look to utilize compression stocking to help with symptoms.      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 She is motivated to manage her weight and improve her health. Start Wegovy  0.25 mg weekly, with potential dose escalation to minimize side  effects like nausea. Advise lifestyle modifications: reduce carbohydrate intake, avoid sugary drinks, and incorporate muscle-building exercises. Recommend using a TRX machine for strength training and encourage continuation of fitness classes. Schedule a follow-up in one month to reassess progress and adjust medication if necessary.      Prediabetes   Pulmonary nodule   Last Assessment & Plan 06/25/2023 Office Visit Written 06/25/2023 12:04 PM by [place], Caitlin Patrick  Noted on prior imaging with recommendation for follow-up CT scan in 6 to 12 months.  She is within window to arrange for follow-up CT scan, amenable to proceeding with this, order placed today.      Uterine leiomyoma   Weight gain    Wellness examination   Last Assessment & Plan 02/24/2023 Office Visit Written 02/24/2023 10:07 AM by [place], Caitlin Patrick  Routine HCM labs reviewed. HCM reviewed/discussed. Anticipatory guidance regarding healthy weight, lifestyle and choices given. Recommend healthy diet.  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 Consider vitamin deficiency. Recommend mindfulness practices and stress reduction techniques. Advise daily prenatal vitamin supplementation and encourage daily coffee intake as moderate caffeine intake may be protective. Discuss the 20-20-20 rule for screen time management.     :1} {   Updated Problem List Entries: No problems updated.   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 Past Medical History:  has a past medical history of DVT (deep  venous thrombosis) (HCC), Sickle cell trait, Uterine leiomyoma (04/29/2022), and Vaginal Pap smear, abnormal. Past Surgical History:   has a past surgical history that includes Wisdom tooth extraction; Multiple tooth extractions (Left); Cesarean section; and Carpal tunnel release (Right, 05/01/2021). Social History:   reports that she has quit smoking. She has been exposed to tobacco smoke. She has never used smokeless tobacco. She reports that she does not currently use alcohol. She reports that she does not currently use drugs after having used the following drugs: Marijuana. Family History:  family history includes Breast cancer in her mother; Cancer in her mother; Diabetes in her maternal grandmother and paternal grandmother; Hypertension in her father. Allergies:  has no known allergies.   Medication Reconciliation: Current Outpatient Medications on File Prior to Visit  Medication Sig   escitalopram  (LEXAPRO ) 10 MG tablet Take 1 tablet (10 mg total) by mouth daily. Start at half tablet daily. (Patient not taking: Reported on 02/06/2024)   semaglutide -weight management (WEGOVY ) 0.25 MG/0.5ML SOAJ SQ injection Inject 0.25 mg into the skin once a week.   No current facility-administered medications on  file prior to visit.  There are no discontinued medications.   Physical Exam:    02/06/2024    9:05 AM 12/23/2023   10:24 AM 11/10/2023    6:10 PM  Vitals with BMI  Height 5' 10 5' 10 5' 10  Weight 231 lbs 237 lbs 10 oz 230 lbs  BMI 33.15 34.09 33  Systolic 120 120 862  Diastolic 80 76 86  Pulse 90 68 71  Vital signs reviewed.  Nursing notes reviewed. Weight trend reviewed. Physical Activity: Insufficiently Active (02/14/2023)   Exercise Vital Sign    Days of Exercise per Week: 3 days    Minutes of Exercise per Session: 30 min   General Appearance:  No acute distress appreciable.   Well-groomed, healthy-appearing female.  Well proportioned with no abnormal fat distribution.  Good  muscle tone. Pulmonary:  Normal work of breathing at rest, no respiratory distress apparent. SpO2: 97 %  Musculoskeletal: All extremities are intact.  Neurological:  Awake, alert, oriented, and engaged.  No obvious focal neurological deficits or cognitive impairments.  Sensorium seems unclouded.   Speech is clear and coherent with logical content. Psychiatric:  Appropriate mood, pleasant and cooperative demeanor, thoughtful and engaged during the exam   Verbalized to patient: Physical Exam MEASUREMENTS: BMI- 30.0.   Results:   Verbalized to patient: Results LABS ALT: 57 (12/2021)     02/06/2024    9:04 AM 12/23/2023   10:29 AM 06/25/2023    8:10 AM 02/24/2023    9:56 AM  PHQ 2/9 Scores  PHQ - 2 Score 0 0 0 0  PHQ- 9 Score   0 0    { {Insert previous labs (optional):23779} {See past labs  Heme  Chem  Endocrine  Serology  Results Review (optional):1} No results found for any visits on 02/06/24.} Office Visit on 12/23/2023  Component Date Value Ref Range Status   WBC 12/23/2023 7.5  4.0 - 10.5 K/uL Final   RBC 12/23/2023 4.43  3.87 - 5.11 Mil/uL Final   Hemoglobin 12/23/2023 12.4  12.0 - 15.0 g/dL Final   HCT 90/97/7974 37.4  36.0 - 46.0 % Final   MCV 12/23/2023 84.6  78.0 - 100.0 fl Final   MCHC 12/23/2023 33.1  30.0 - 36.0 g/dL Final   RDW 90/97/7974 14.5  11.5 - 15.5 % Final   Platelets 12/23/2023 300.0  150.0 - 400.0 K/uL Final   Neutrophils Relative % 12/23/2023 49.7  43.0 - 77.0 % Final   Lymphocytes Relative 12/23/2023 41.2  12.0 - 46.0 % Final   Monocytes Relative 12/23/2023 7.4  3.0 - 12.0 % Final   Eosinophils Relative 12/23/2023 1.3  0.0 - 5.0 % Final   Basophils Relative 12/23/2023 0.4  0.0 - 3.0 % Final   Neutro Abs 12/23/2023 3.7  1.4 - 7.7 K/uL Final   Lymphs Abs 12/23/2023 3.1  0.7 - 4.0 K/uL Final   Monocytes Absolute 12/23/2023 0.6  0.1 - 1.0 K/uL Final   Eosinophils Absolute 12/23/2023 0.1  0.0 - 0.7 K/uL Final   Basophils Absolute 12/23/2023 0.0   0.0 - 0.1 K/uL Final   Sodium 12/23/2023 140  135 - 145 mEq/L Final   Potassium 12/23/2023 3.9  3.5 - 5.1 mEq/L Final   Chloride 12/23/2023 104  96 - 112 mEq/L Final   CO2 12/23/2023 24  19 - 32 mEq/L Final   Glucose, Bld 12/23/2023 77  70 - 99 mg/dL Final   BUN 90/97/7974 8  6 - 23 mg/dL Final  Creatinine, Ser 12/23/2023 0.67  0.40 - 1.20 mg/dL Final   Total Bilirubin 12/23/2023 0.3  0.2 - 1.2 mg/dL Final   Alkaline Phosphatase 12/23/2023 75  39 - 117 U/L Final   AST 12/23/2023 22  0 - 37 U/L Final   ALT 12/23/2023 22  0 - 35 U/L Final   Total Protein 12/23/2023 7.4  6.0 - 8.3 g/dL Final   Albumin 90/97/7974 4.0  3.5 - 5.2 g/dL Final   GFR 90/97/7974 110.66  >60.00 mL/min Final   Calcium 12/23/2023 8.7  8.4 - 10.5 mg/dL Final   Cholesterol 90/97/7974 150  0 - 200 mg/dL Final   Triglycerides 90/97/7974 64.0  0.0 - 149.0 mg/dL Final   HDL 90/97/7974 45.90  >39.00 mg/dL Final   VLDL 90/97/7974 12.8  0.0 - 40.0 mg/dL Final   LDL Cholesterol 12/23/2023 91  0 - 99 mg/dL Final   Total CHOL/HDL Ratio 12/23/2023 3   Final   NonHDL 12/23/2023 104.21   Final   Hgb A1c MFr Bld 12/23/2023 5.9  4.6 - 6.5 % Final   TSH 12/23/2023 1.820  0.450 - 4.500 uIU/mL Final  Orders Only on 06/25/2023  Component Date Value Ref Range Status   Total Protein 06/25/2023 7.0  6.0 - 8.5 g/dL Final   Albumin 96/94/7974 4.0  3.9 - 4.9 g/dL Final   Bilirubin Total 06/25/2023 0.2  0.0 - 1.2 mg/dL Final   Bilirubin, Direct 06/25/2023 0.10  0.00 - 0.40 mg/dL Final   Alkaline Phosphatase 06/25/2023 99  44 - 121 IU/L Final   AST 06/25/2023 17  0 - 40 IU/L Final   ALT 06/25/2023 14  0 - 32 IU/L Final  Orders Only on 02/10/2023  Component Date Value Ref Range Status   WBC 02/10/2023 7.3  3.4 - 10.8 x10E3/uL Final   RBC 02/10/2023 4.00  3.77 - 5.28 x10E6/uL Final   Hemoglobin 02/10/2023 11.4  11.1 - 15.9 g/dL Final   Hematocrit 89/78/7975 34.8  34.0 - 46.6 % Final   MCV 02/10/2023 87  79 - 97 fL Final   MCH  02/10/2023 28.5  26.6 - 33.0 pg Final   MCHC 02/10/2023 32.8  31.5 - 35.7 g/dL Final   RDW 89/78/7975 13.7  11.7 - 15.4 % Final   Platelets 02/10/2023 308  150 - 450 x10E3/uL Final   Neutrophils 02/10/2023 48  Not Estab. % Final   Lymphs 02/10/2023 43  Not Estab. % Final   Monocytes 02/10/2023 7  Not Estab. % Final   Eos 02/10/2023 2  Not Estab. % Final   Basos 02/10/2023 0  Not Estab. % Final   Neutrophils Absolute 02/10/2023 3.5  1.4 - 7.0 x10E3/uL Final   Lymphocytes Absolute 02/10/2023 3.1  0.7 - 3.1 x10E3/uL Final   Monocytes Absolute 02/10/2023 0.5  0.1 - 0.9 x10E3/uL Final   EOS (ABSOLUTE) 02/10/2023 0.1  0.0 - 0.4 x10E3/uL Final   Basophils Absolute 02/10/2023 0.0  0.0 - 0.2 x10E3/uL Final   Immature Granulocytes 02/10/2023 0  Not Estab. % Final   Immature Grans (Abs) 02/10/2023 0.0  0.0 - 0.1 x10E3/uL Final   Glucose 02/10/2023 74  70 - 99 mg/dL Final   BUN 89/78/7975 10  6 - 20 mg/dL Final   Creatinine, Ser 02/10/2023 0.76  0.57 - 1.00 mg/dL Final   eGFR 89/78/7975 103  >59 mL/min/1.73 Final   BUN/Creatinine Ratio 02/10/2023 13  9 - 23 Final   Sodium 02/10/2023 142  134 - 144 mmol/L Final  Potassium 02/10/2023 4.2  3.5 - 5.2 mmol/L Final   Chloride 02/10/2023 104  96 - 106 mmol/L Final   CO2 02/10/2023 21  20 - 29 mmol/L Final   Calcium 02/10/2023 9.8  8.7 - 10.2 mg/dL Final   Total Protein 89/78/7975 7.1  6.0 - 8.5 g/dL Final   Albumin 89/78/7975 4.2  3.9 - 4.9 g/dL Final   Globulin, Total 02/10/2023 2.9  1.5 - 4.5 g/dL Final   Bilirubin Total 02/10/2023 <0.2  0.0 - 1.2 mg/dL Final   Alkaline Phosphatase 02/10/2023 110  44 - 121 IU/L Final   AST 02/10/2023 33  0 - 40 IU/L Final   ALT 02/10/2023 57 (H)  0 - 32 IU/L Final   Cholesterol, Total 02/10/2023 163  100 - 199 mg/dL Final   Triglycerides 89/78/7975 92  0 - 149 mg/dL Final   HDL 89/78/7975 50  >39 mg/dL Final   VLDL Cholesterol Cal 02/10/2023 17  5 - 40 mg/dL Final   LDL Chol Calc (NIH) 02/10/2023 96  0 - 99  mg/dL Final   Chol/HDL Ratio 02/10/2023 3.3  0.0 - 4.4 ratio Final   Hgb A1c MFr Bld 02/10/2023 5.4  4.8 - 5.6 % Final   Est. average glucose Bld gHb Est-m* 02/10/2023 108  mg/dL Final   TSH 89/78/7975 1.310  0.450 - 4.500 uIU/mL Final  Office Visit on 10/07/2022  Component Date Value Ref Range Status   WBC 10/07/2022 6.9  3.4 - 10.8 x10E3/uL Final   RBC 10/07/2022 4.32  3.77 - 5.28 x10E6/uL Final   Hemoglobin 10/07/2022 12.4  11.1 - 15.9 g/dL Final   Hematocrit 93/82/7975 37.4  34.0 - 46.6 % Final   MCV 10/07/2022 87  79 - 97 fL Final   MCH 10/07/2022 28.7  26.6 - 33.0 pg Final   MCHC 10/07/2022 33.2  31.5 - 35.7 g/dL Final   RDW 93/82/7975 13.0  11.7 - 15.4 % Final   Platelets 10/07/2022 341  150 - 450 x10E3/uL Final   Neutrophils 10/07/2022 51  Not Estab. % Final   Lymphs 10/07/2022 39  Not Estab. % Final   Monocytes 10/07/2022 8  Not Estab. % Final   Eos 10/07/2022 2  Not Estab. % Final   Basos 10/07/2022 0  Not Estab. % Final   Neutrophils Absolute 10/07/2022 3.5  1.4 - 7.0 x10E3/uL Final   Lymphocytes Absolute 10/07/2022 2.7  0.7 - 3.1 x10E3/uL Final   Monocytes Absolute 10/07/2022 0.6  0.1 - 0.9 x10E3/uL Final   EOS (ABSOLUTE) 10/07/2022 0.1  0.0 - 0.4 x10E3/uL Final   Basophils Absolute 10/07/2022 0.0  0.0 - 0.2 x10E3/uL Final   Immature Granulocytes 10/07/2022 0  Not Estab. % Final   Immature Grans (Abs) 10/07/2022 0.0  0.0 - 0.1 x10E3/uL Final   Total Iron Binding Capacity 10/07/2022 326  250 - 450 ug/dL Final   UIBC 93/82/7975 274  131 - 425 ug/dL Final   Iron 93/82/7975 52  27 - 159 ug/dL Final   Iron Saturation 10/07/2022 16  15 - 55 % Final   Ferritin 10/07/2022 51  15 - 150 ng/mL Final  Admission on 09/16/2022, Discharged on 09/16/2022  Component Date Value Ref Range Status   WBC 09/16/2022 6.6  4.0 - 10.5 K/uL Final   RBC 09/16/2022 4.12  3.87 - 5.11 MIL/uL Final   Hemoglobin 09/16/2022 11.9 (L)  12.0 - 15.0 g/dL Final   HCT 94/72/7975 34.7 (L)  36.0 - 46.0 %  Final  MCV 09/16/2022 84.2  80.0 - 100.0 fL Final   MCH 09/16/2022 28.9  26.0 - 34.0 pg Final   MCHC 09/16/2022 34.3  30.0 - 36.0 g/dL Final   RDW 94/72/7975 13.1  11.5 - 15.5 % Final   Platelets 09/16/2022 304  150 - 400 K/uL Final   nRBC 09/16/2022 0.0  0.0 - 0.2 % Final   Sodium 09/16/2022 139  135 - 145 mmol/L Final   Potassium 09/16/2022 3.9  3.5 - 5.1 mmol/L Final   Chloride 09/16/2022 104  98 - 111 mmol/L Final   CO2 09/16/2022 29  22 - 32 mmol/L Final   Glucose, Bld 09/16/2022 81  70 - 99 mg/dL Final   BUN 94/72/7975 6  6 - 20 mg/dL Final   Creatinine, Ser 09/16/2022 0.65  0.44 - 1.00 mg/dL Final   Calcium 94/72/7975 9.2  8.9 - 10.3 mg/dL Final   GFR, Estimated 09/16/2022 >60  >60 mL/min Final   Anion gap 09/16/2022 6  5 - 15 Final   Troponin I (High Sensitivity) 09/16/2022 2  <18 ng/L Final  Admission on 09/11/2022, Discharged on 09/11/2022  Component Date Value Ref Range Status   Sodium 09/11/2022 140  135 - 145 mmol/L Final   Potassium 09/11/2022 3.6  3.5 - 5.1 mmol/L Final   Chloride 09/11/2022 105  98 - 111 mmol/L Final   CO2 09/11/2022 28  22 - 32 mmol/L Final   Glucose, Bld 09/11/2022 92  70 - 99 mg/dL Final   BUN 94/77/7975 6  6 - 20 mg/dL Final   Creatinine, Ser 09/11/2022 0.67  0.44 - 1.00 mg/dL Final   Calcium 94/77/7975 9.5  8.9 - 10.3 mg/dL Final   GFR, Estimated 09/11/2022 >60  >60 mL/min Final   Anion gap 09/11/2022 7  5 - 15 Final   WBC 09/11/2022 8.0  4.0 - 10.5 K/uL Final   RBC 09/11/2022 4.11  3.87 - 5.11 MIL/uL Final   Hemoglobin 09/11/2022 11.9 (L)  12.0 - 15.0 g/dL Final   HCT 94/77/7975 34.5 (L)  36.0 - 46.0 % Final   MCV 09/11/2022 83.9  80.0 - 100.0 fL Final   MCH 09/11/2022 29.0  26.0 - 34.0 pg Final   MCHC 09/11/2022 34.5  30.0 - 36.0 g/dL Final   RDW 94/77/7975 13.2  11.5 - 15.5 % Final   Platelets 09/11/2022 308  150 - 400 K/uL Final   nRBC 09/11/2022 0.0  0.0 - 0.2 % Final  No image results found. DG Finger Index Right Result Date:  11/10/2023 CLINICAL DATA:  Status post trauma. EXAM: RIGHT INDEX FINGER 2+V COMPARISON:  None Available. FINDINGS: There is no evidence of fracture or dislocation. There is no evidence of arthropathy or other focal bone abnormality. Soft tissues are unremarkable. IMPRESSION: Negative. Electronically Signed   By: Suzen Dials M.D.   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 Previous lifestyle interventions, including dietary changes and exercise, have been ineffective. Insurance coverage for GLP-1 agonists like Wegovy  is being pursued, with documentation of comorbidities and past weight management attempts. There are no contraindications such as medullary thyroid carcinoma or multiple endocrine  neoplasia. A metformin trial is planned to document treatment attempts. She is open to considering bariatric surgery if necessary. Check Cigna insurance coverage for GLP-1 agonists. Document previous lifestyle interventions and comorbidities for insurance purposes. Order a dietitian consultation to support lifestyle changes. Initiate a metformin trial and monitor for nausea. Complete prior authorization for Wegovy  with supporting documentation. Schedule monthly follow-ups for weight management and side effect monitoring.  Anxiety disorder   Her anxiety disorder was previously managed with escitalopram , which was discontinued due to nausea and discomfort. Buspirone is considered as an alternative treatment. There was a discussion on the effects of cannabis on anxiety, highlighting its potential to exacerbate symptoms. Prescribe buspirone for anxiety management. Provide  Atarax for occasional sleep disturbances. Educate on the effects of cannabis on anxiety.  Recording duration: 16 minutes   }  ORDER ASSOCIATIONS  #   DIAGNOSIS / CONDITION ICD-10 ENCOUNTER ORDER  No diagnosis found.       Orders Placed in Encounter:   Lab Orders  No laboratory test(s) ordered today   Imaging Orders  No imaging studies ordered today   Referral Orders  No referral(s) requested today   No orders of the defined types were placed in this encounter.   No orders of the defined types were placed in this encounter. ED Discharge Orders     None         This document was synthesized by artificial intelligence (Abridge) using HIPAA-compliant recording of the clinical interaction;   We discussed the use of AI scribe software for clinical note transcription with the patient, who gave verbal consent to proceed. additional Info: This encounter employed state-of-the-art, real-time, collaborative documentation. The patient actively reviewed and assisted in updating their electronic medical record on a shared screen, ensuring transparency and facilitating joint problem-solving for the problem list, overview, and plan. This approach promotes accurate, informed care. The treatment plan was discussed and reviewed in detail, including medication safety, potential side effects, and all patient questions. We confirmed understanding and comfort with the plan. Follow-up instructions were established, including contacting the office for any concerns, returning if symptoms worsen, persist, or new symptoms develop, and precautions for potential emergency department visits.    Skip to content You said: patient with cigna group [id number] issuer [phone]  she has prediabetes, no sleep testing yet, no confirmed fatty liver, bmi 33.  what do we need to document to get coverage of a glp-1 and which one can we get ChatGPT said: I'm not aware of any publicly-available [facility] policy documents specific  to group [id number] / issuer [phone] (that would be your employer's plan), so ultimately the "playbook" is to figure out your employer's benefit design + the formulary / prior authorization (PA) rules for your plan. But I can outline what is usually required (and what your odds are) in a patient like yours (BMI 33, prediabetes, no confirmed fatty liver, etc.), and pitfalls/strategies to increase chances of approval. You'll want to verify all of this with your plan's drug benefit / prior authorization department.  What we need to document (for a PA / medical necessity request) To have a reasonable chance of coverage of a GLP-1 (or dual GIP/GLP like tirzepatide) in someone who is not yet diabetic, but has obesity / metabolic risk (i.e. prediabetes), here's what is typically required or helpful (based on insurer practices and published insurer/advocacy guides) :  Item Why it matters What you should collect/document Diagnosis / clinical justification The insurer will want a clear diagnosis  or high-risk status -- in many cases they only cover GLP-1s if there is an FDA-approved indication, or under "weight management / obesity therapy" if that is part of your benefit. Document that the patient has a BMI >= 30 (which is true, she has BMI 33). Confirm two BMI measurements, ideally spaced apart (e.g. initial and a confirmation) in the medical record. Comorbidities / risk factors Many plans require either that the patient has comorbidities (e.g. hypertension, dyslipidemia, sleep apnea, prediabetes progressing, nonalcoholic fatty liver disease [NAFLD]) or at least metabolic risk, to justify that the GLP-1 is not "cosmetic." Document her prediabetes (e.g. HbA1c in the 5.7-6.4% range, or confirm via lab), any history of hypertension / dyslipidemia, any abnormal liver enzymes (to suggest possible fatty liver), or imaging / liver function tests if available, etc. Lifestyle/diet/exercise efforts Insurers commonly require  evidence that non-drug strategies have been tried. Document at least 3 months (sometimes 6 months) of structured lifestyle intervention: counseling on diet, exercise, weight loss efforts, or participation in a weight management program (or even diabetes prevention program). Show records: dietitian visits, clinic notes of weight loss attempts, behavioral modification programs. Lab work / safety labs To ensure the drug is safe (no contraindications) and to demonstrate baseline and follow-up monitoring. Baseline labs including: HbA1c, liver function tests (ALT/AST), TSH, renal function, lipid panel, possibly pancreatic enzymes if needed, etc. Also document that there is no contraindication (e.g. history of medullary thyroid carcinoma, multiple endocrine neoplasia) if relevant. Monitoring plan / follow-up Insurers like to see that the prescribing physician has a plan for follow-up and monitoring (efficacy, side effects). Outline a plan for follow-up: weight checks, lab monitoring, side effect surveillance, regular office follow-ups. Prior or alternative therapies / rationale Many formularies require "step therapy" (e.g. trial of metformin or other lower-cost interventions) or explanation of why other options are not suitable or insufficient. Document whether the patient has tried metformin (if applicable), whether it was insufficient or not suitable. Also, in some cases, explain why insulin or other antidiabetic therapies are not appropriate if her glycemic risk is modest. Letter of Medical Necessity / Physician narrative To tie everything together and persuade a medical reviewer A well-written narrative that explains why this particular patient needs a GLP-1 (i.e. risk of progression to diabetes, cardiovascular benefit, weight mitigation, delay of complications), referencing guidelines (e.g. ADA, obesity guidelines), and why other options are insufficient. Documentation of response / reauthorization criteria For  ongoing coverage, insurers often require demonstration of benefit (e.g. weight loss threshold) After initiation, track % weight loss (many insurers require >= 5% weight loss within 6-12 months), changes in metabolic labs, etc., and submit for renewal. If any required documentation is missing, the PA could be denied (request more info) or rejected. So being as comprehensive as possible helps.  Which GLP-1 or GLP/GIP agents might be covered / realistic in this scenario Because your patient has prediabetes (i.e. not yet full type 2 diabetes), coverage is more challenging. Many insurers restrict coverage of GLP-1s only for FDA-approved indications, which often are type 2 diabetes, or in some cases obesity / weight management if that is part of the benefit. Some relevant points / observations:  Cigna generally requires prior authorization for GLP-1 medications (ozempic, wegovy , tirzepatide, etc).  SingleCare +2 SingleCare +2  Cigna's coverage tends to favor GLP-1s when used for type 2 diabetes, rather than for weight loss in patients without diabetes.  SingleCare +1  For drugs whose primary FDA approval is for weight loss (e.g. Wegovy , Zepbound), [facility]  may require additional documentation (BMI, prior lifestyle interventions, 5% weight loss in a time frame, etc).  [email or web address] +1  Because your patient is not yet diabetic, using a GLP-1 for weight management is a more difficult justification (a higher bar). But if your plan covers it under obesity therapy, you might get approval with strong documentation.  Saxenda (liraglutide for weight loss) is reportedly no longer on Cigna's national preferred drug list for weight management.  SingleCare  Some suggest that coverage is more likely (or easier) for the GLP-1s already on formulary for diabetes (e.g. semaglutide  [Ozempic], liraglutide [Victoza in diabetes indication], etc) than for weight-loss-only products.  SingleCare +2 [facility]  [facility] +2  Cigna recently announced a cap of $200/month out-of-pocket for Wegovy  / Zepbound in some of its pharmacy benefit plans, which may make those more accessible for members whose plans include them.  Reuters  But just because the drug is on a formulary or there is a benefit limit doesn't mean your plan will cover it -- your employer may have "carve-outs" or exclusions for weight-loss medications.  SingleCare +2 [employer] +2  Given all that, in your patient's case:  Using a GLP-1 approved for diabetes (e.g. semaglutide  / Ozempic, liraglutide [for diabetes]) may give you a better chance if you can frame the use as prevention or metabolic risk reduction -- though it will be off-label for prediabetes, so the insurer might resist.  Using a GLP-1 (or GLP/GIP) approved for weight loss (e.g. Wegovy , Zepbound) is more challenging in someone without established obesity benefit coverage by the plan, but may be possible if your plan permits weight-loss drug coverage and you meet their PA criteria (BMI, lifestyle attempts, etc).  So the "which one" depends heavily on whether your plan "allows" weight loss GLP-1s.  If the plan covers weight management / obesity therapy, then Wegovy  or Zepbound might be options, provided you can satisfy the criteria.  If they restrict to diabetes indications, then Ozempic (semaglutide ) is more likely to be covered if you can argue metabolic risk / prediabetes progression.  Liraglutide (for weight loss / Saxenda) is likely not viable under Cigna's weight-management list currently.  SingleCare  Pitfalls & strategies, and things to check Check your specific plan's formulary / benefit design: Determine whether weight-loss or obesity drugs are explicitly excluded. Even if the [employer] formulary includes a drug, your employer's plan might "opt out" of that benefit.  Pre-authorization is very likely required: Almost always you'll have to submit a PA with all the  above documentation.  Peer-to-peer / appeals: If initial PA is denied, plan to make an appeal and possibly request a peer-to-peer.  Start with the "lowest barrier" drug: If your plan already covers a diabetes-approved GLP at lower tier, start with that and build evidence.  Use robust narrative / guidelines: Reference ADA prediabetes / obesity guidelines, data showing that GLP-1s reduce progression of metabolic disease, etc.  Track outcomes strictly: Weight change, lab metrics, side effects -- for reauthorization.  Check for any "carve-outs" / plan exclusions: Employer plans sometimes exclude weight loss drug coverage as a cost-saving measure.  Document everything: The more complete the request, the less likely it will get held up for "lack of information."  Explore patient assistance / manufacturer programs as fallback if insurance denies.  Bottom line & next steps for your case Given your patient's BMI of 33 and prediabetes:  You have a moderate chance of getting coverage, but it is not guaranteed because she does not yet have  full-blown type 2 diabetes, which is the more common indication insurers accept.  Your best shot is a strong prior authorization with comprehensive documentation (as per the items above).  The "best" GLP-1 to request is the one your plan is most likely to cover (i.e. one already on formulary, ideally a diabetes-approved one). If your plan supports obesity therapy, then Wegovy  or Zepbound might be possible.  Start by contacting the plan's pharmacy benefit / PA department, ask for their GLP-1 / obesity drug prior authorization criteria, and then submit a PA with full documentation.  If you like, I can try to find the exact prior authorization form / criteria for [employer] group [id number] / issuer [phone] (if available), or check formulary options for your plan so we can pick the most likely GLP-1. Do you want me to try that for your specific plan?  Here's how I'd approach  this (from a payer / prior?authorization perspective) -- and caveats, since much depends on your specific [employer]ign[employer] plan (employer benefit, formulary, prior [place] rules). But based on publicly available policies and Land, this is what you'll want to document -- and the GLP-1 / related drugs you might ask about.  Key issues / challenges in your scenario Your patient has prediabetes (not yet T2DM), BMI 33 (which meets obesity threshold), no confirmed fatty liver, and no sleep study yet. That means:  She does not yet meet diagnostic criteria for T2DM (if labs confirm A1c < 6.5% or equivalent). Many GLP-1 rules require a diagnosis of T2DM (for coverage under diabetes indication).  If trying to get coverage for a GLP-1 for "weight management / obesity" ("off?label" relative to diabetes), many plans are stricter (some cover semaglutide  (Wegovy ), tirzepatide, etc, for weight loss, but require stronger documentation).  The insurer will be looking for "medical necessity" -- i.e. that her weight/obesity is causing or will cause enough disease burden to justify this therapy, and that she has already tried less intensive measures (lifestyle, behavioral, diet, etc).  For renewal, the insurer will often require evidence of response (e.g. >=?5?% weight loss, continued improvements / benefit, absence of contraindications).  What you need to document to maximize chances of approval Here's a checklist / recommended documentation. Not all plans require all of this, but having it strengthens your case.  Type of documentation What to include / emphasize Why it helps Clinical diagnosis Although she has prediabetes, articulate the risk (e.g. rising glycemic markers, insulin resistance, worsening metabolic syndrome, evidence of progression) Some plans will strictly require an ICD-10 diagnosis of T2DM; without it, GLP-1 requests might be rejected or "not indicated." Baseline labs / measures Recent A1c, fasting  glucose, OGTT (if done), lipid panel, liver enzymes (ALT/AST), HOMA-IR or insulin levels if available, hepatic imaging / noninvasive fibro tests (like FibroScan or Fibrosis-4 index) if considering NAFLD/steatohepatitis Show metabolic burden, possible early liver injury, justify that weight reduction is not just cosmetic but medically relevant. BMI / anthropometrics Height, weight, BMI, waist circumference, fat distribution (visceral vs subcutaneous, if imaging) Many policies start with BMI >= 30 as threshold; your patient meets that. Comorbidities / risk factors Hypertension, dyslipidemia, sleep apnea (or high suspicion), cardiovascular risk factors (e.g. family history, coronary disease), obstructive sleep apnea symptoms (snoring, daytime somnolence), nonalcoholic fatty liver disease (even if unconfirmed, document suspicion, elevated liver enzymes, imaging if available) These count as "weight-related comorbidities," which many payers require when BMI is in "overweight + comorbidity" ranges. Lifestyle intervention history Document that she has attempted (or is participating in) a  structured program: e.g. dietitian visits, caloric restriction, exercise regimen, behavioral therapy, weight loss programs (e.g. "3 months of dietary + exercise intervention," counseling) Insurers want evidence that you've "tried less intensive measures" before stepping up to drug therapy. Follow-up plan & monitoring Your plan for dose escalation, monitoring for adverse effects, labs (e.g. renal, hepatic, pancreatic enzymes), periodic weight checks, follow-up visits, and criteria for discontinuation if ineffective Shows that this is a well-thought medical therapy, not a speculative trial. Rationale / letter of medical necessity A narrative (from provider) summarizing why this patient needs GLP-1 therapy (risks of progression from prediabetes to diabetes, expected benefit, failure / contraindications of other options), cost-benefit, and  how this aligns with evidence/guidelines For payers, the letter helps reviewers see this is not purely "weight loss" but part of metabolic disease management. Response / outcome documentation (for continuation) At renewal, show >=?5?% weight loss (or whatever the plan's threshold), improvements in labs (glucose, lipids, liver enzymes, etc), tolerability, no contraindications Many plans require demonstrated benefit to continue coverage. Also, check the formulary / prior authorization forms for your exact [employer]ign[employer] plan: [facility] provides commercial drug prior authorization forms.  Cigna  Use ePA (electronic prior authorization) when available.  Cigna +1  It may also help to call the plan (using the customer service number on the patient's card) and request the clinical criteria / formulary policy for GLP-1s / weight management medications under group [id number] (issuer [phone]) to see exactly what their thresholds are.  Which GLP-1 / analogous drugs are most feasible to try for coverage Given she doesn't yet have overt diabetes, you'll have to see whether your plan allows GLP-1 use for weight / obesity management rather than strictly for diabetes. From what's publicly known about Cigna policies:  Wegovy  (semaglutide  2.4?mg for obesity) -- Cigna generally covers Wegovy  when specific clinical criteria are met: BMI >=?30 or >=?27 + comorbidity, plus documentation of lifestyle intervention.  [email or web address] +2 CGAA +2  Zepbound (the tirzepatide formulation for weight loss) -- [facility] may require similar criteria: BMI thresholds, lifestyle documentation, and possibly a trial period. After initiation, many plans require >=?5% weight loss to continue coverage.  Ro  Ozempic (semaglutide  for diabetes / glycemic control) -- Because your patient is not yet diabetic, Ozempic is less likely to be covered under that indication unless the plan explicitly allows it for off-label weight use. Cigna's weight/GLP-1 rules  suggest they often restrict using Ozempic for weight if no diabetes diagnosis.  SingleCare +2 [email or web address] +2  Other GLP-1s (liraglutide, dulaglutide, etc.) -- Some may be covered under diabetes indication if that diagnosis exists; liraglutide (Victoza) is sometimes included on Cigna's formulary for T2DM, but not necessarily for weight loss (Saxenda / liraglutide for obesity is less reliably covered)  SingleCare  In short: Wegovy  or Zepbound are your best bets for weight / obesity coverage (if her plan includes a weight?management benefit) rather than one of the diabetes?indication GLP-1s. But whether they'll approve it when she only has prediabetes is uncertain -- depends heavily on the plan's benefit design and willingness to fund "metabolic therapy early."  Practical strategy Get baseline labs & imaging: Run A1c, fasting glucose, OGTT if not already, liver function tests, lipid panel, and, if possible, hepatic imaging or noninvasive fibrosis scores.  Document weight loss attempts: Ensure you have records (dietitian notes, behavioral programs, exercise plans) for at least 3 months (or more, depending on plan).  Write a strong letter of medical necessity: Emphasize risk of progression, metabolic syndrome, early liver disease risk, etc.  Submit PA via ePA / CoverMyMeds or the Cigna prior authorization portal using the official form.  Cigna +1  Be ready for appeal / peer-to-peer: If initially denied, you'll likely need to go to step 2, appeal, involving your endocrinologist / obesity medicine specialist.  At renewal, document clinical benefit: weight loss, improvements in labs, safety.  Check with the plan: Before prescribing, call or look up the member's benefit file (e.g. via provider portal or benefit administrator) to see whether GLP-1 for weight is included under group [id number] (issuer [phone]). Some employer plans choose not to cover obesity drugs at all.  If you like, I can try  to pull up the specific prior authorization / coverage policy for your exact [employer]igna group [phone] issuer [phone]) and see what they accept. Would you like me to dig that deeper?  You said: try again ChatGPT said: I wasn't able to find a publicly available prior authorization / coverage policy specific to [facility] group [id number] / issuer [phone]. But I did locate more general Cigna (and industry) GLP-1 / weight-loss policy information that gives insight into what you'll likely face. Below is a refined, evidence-based guess + strategic suggestions. Use this as a guide when you try to pull the "internal document" for that group.  What I found: Cigna / GLP-1 / weight-loss prior authorization policies Here are the relevant bits from [place] / payer documents and third-party breakdowns:  Policy / source Key points Implication for your patient "Why clinical information matters in PA requests for weight loss medications" (Cigna provider news) The insurer asks for BMI, previous weight loss attempts, related health conditions, psychological factors, etc.  Provider Communications Any PA submission must have robust clinical documentation, not just "I want to use GLP-1." Cigna / "Does Cigna cover weight loss GLP-1s" (SingleCare) Cigna generally requires prior authorization for all GLP-1s (e.g. Ozempic, Wegovy , Mounjaro, Zepbound), regardless of indication.  SingleCare Even if your chosen GLP-1 is on formulary, you'll need to navigate the PA process. [facility] / "Does Cigna cover Saxenda?" Alcide is being de?emphasized (or removed) in many Cigna plans for weight management; [facility] requires "medically necessary" justification.  SingleCare Saxenda is probably not your best bet; coverage is less reliable. Cigna / "Does Cigna cover Wegovy ?" [facility] will require PA showing BMI, comorbidities, evidence of prior lifestyle intervention, specialist recommendation, treatment plan. Also, reauthorization often requires >=?5% weight loss  to continue.  [employer] Wegovy  is one of the more favorable candidates among the GLP-1s for weight use, but still with substantial hurdles. [employer] / "Does [employer] cover Zepbound?" If covered by that plan, PA is usually required. The provider must show 1) age >=?18, 2) 3+ months of lifestyle changes, 3) BMI >=?30 (or >=?27 with a weight-related condition), 4) plan to use drug + lifestyle changes. For continuation, must show >=?5% weight loss.  Ro Zepbound is another strong candidate in plans that include obesity drugs. [employer] Psychologist, clinical) Some fully insured [employer] HMO plans allow "medical necessity override" for weight loss drugs, but only in morbid obesity cases. Otherwise, GLP-1s may be covered only in diabetes management unless the employer rider is added.  [employer] Whether your specific group has a "weight loss drug rider" is crucial. Broader trends [facility] has started capping copays for Wegovy  and Zepbound (e.g. $200/month cap) in some of its participating plans.  [employer] +1  Also, as per a news release, more than 50% of [employer]'s "[place]" clients (large employers) offer weight-loss drug coverage; but  among smaller employer group plans (like many [employer]), only ~15-20% offer it.  [employer] Even if [facility] can cover the drug, your plan [phone]) might not have opted into covering GLP-1s for weight loss. What you should try to document (in light of these policies) Given the general patterns, here's a sharper actionable list (which overlaps with my prior answer, but now better tailored to what [employer] is requiring). When you submit a PA, try to include:  Clear statement of plan benefit / rider  If you can, obtain from [employer] (or from the employer's benefits administrator) whether this particular group [id number] has a "weight loss / obesity drug benefit" or covers GLP-1s for obesity (versus only for diabetes).  If there is a "rider," refer to  it explicitly in your PA or letter.  Baseline clinical data  Height, weight, BMI (>=?30 is generally a minimum cutoff).  Waist circumference or visceral fat evidence if available.  Lab data: fasting glucose, A1c, lipid panel, liver enzymes, insulin levels or HOMA-IR if done.  If any imaging (e.g. liver ultrasound, MRI, FibroScan) or noninvasive fibrosis scores to explore NAFLD / NASH risk, include them.  Comorbid or weight-related conditions  Even though she doesn't yet have diagnosed T2DM, emphasize prediabetes, metabolic syndrome, hypertension, dyslipidemia, cardiovascular risk, fatty liver suspicion (if mild enzyme elevations), sleep apnea risk (even without sleep test yet).  If she has hypertension, hyperlipidemia, OSA symptoms, sleepiness/snoring, or steatosis suspicion, document them.  Lifestyle intervention history  Document at least 3 months (or more, if you have it) of structured diet, exercise, behavioral programs (e.g. visits with dietitian, weight management program, counseling).  Show weight trends (whether any prior weight loss, how much, how sustained).  Proposed plan for GLP-1 use  Specify the drug, dosage schedule, treatment duration plan.  Include your plan for monitoring (labs, side effects, follow-up visits).  State that the drug will be used in conjunction with lifestyle changes (not as a stand-alone).  Reauthorization / continuation criteria  State up front that you will show >=?5% weight loss (or whatever the plan's threshold is) after a certain interval (e.g. 6-12 months) to justify continuation.  Plan to submit follow-up labs and weight data.  Letter of medical necessity / narrative  Summarize why this patient needs this therapy: risk of progression to type 2 diabetes, morbidity associated with obesity, expected clinical benefit, failure or limitations of other therapies.  Tailor the letter to the plan's policy language (e.g. "medically necessary,"  "weight-related comorbidities," etc.).  Peer-to-peer / appeal readiness  Be ready to escalate (phone / peer review) if denied, to argue with clinical evidence, trial data, and your documented risk profile.  Which GLP-1s are most viable, given typical [employer] coverage patterns Based on what I found:  Wegovy  (semaglutide  for obesity) -- Probably your best shot, because [facility] does explicitly include Wegovy  in its weight loss drug PA policies. But you'll have to show you meet all the criteria (BMI, lifestyle intervention, etc).  Just Simply Well  Zepbound (tirzepatide for obesity) -- Also commonly listed in coverage discussions; many plans that cover obesity GLP-1s will cover Zepbound subject to PA.  Ro +1  Ozempic / Mounjaro -- Usually covered only when prescribed for diabetes (i.e. you need a T2DM diagnosis) or for their FDA-approved indications, not necessarily for weight loss in a prediabetic patient.  SingleCare +2 SingleCare +2  Saxenda (liraglutide for obesity) -- Less favorable; many [employer] plans no longer include Saxenda or have removed it.  SingleCare  Thus,  in your patient's case, Wegovy  or Zepbound are the best options to request under a weight-management indication, assuming your group plan supports that.  Strategic next step: try to locate internal group formulary / PA criteria Because the final determination depends heavily on that specific group ([id number]), I recommend:  Contact [employer] provider services (with the group number and issuer) and ask for the pharmacy medical necessity / formulary policy for GLP-1 / weight loss drugs for that group.  Ask if the group has opted into "weight loss drug coverage" (i.e. a rider beyond standard diabetes coverage).  Request the exact prior authorization form / criteria (the "coverage position criteria" document) that the PBM/pharmacy department uses for that group.  If you like, I can try to see if there is any internal or insurer?file  database that contains the plan documents for group [id number] (issuer [phone]), though that's often proprietary and not public. Would you like me to try to dig that?  You said: please write the well-written narrative that explains why this particular patient needs a GLP-1 (i.e. risk of progression to diabetes, cardiovascular benefit, weight mitigation, delay of complications), referencing guidelines (e.g. ADA, obesity guidelines), and why other options are insufficient.  she struggles to lose weight dieting and does have alt 57 worsening fatty liver disease and at risk for full diabetes with worsening a1c ChatGPT said: Below is a draft Letter of Medical Necessity / Narrative you can adapt for your patient's prior authorization. It weaves her individual clinical picture with guideline- and evidence-based justifications, and explains why other options are inadequate.  Patient: [Name, DOB, identifiers] Diagnosis / Problem List (relevant): Obesity (BMI 33?kg/m), prediabetes, worsening ALT (57 U/L), probable nonalcoholic fatty liver disease (NAFLD) evolving, failed weight?loss with diet, risk of progression to T2DM and complications.  Narrative / Rationale for GLP-1 Therapy I am writing to request coverage of a GLP-1 receptor agonist for this patient because her clinical profile places her at high risk for progression to type 2 diabetes and its complications, and because her weight, liver disease indicators, and failed conservative therapy justify escalation to a GLP-1. Below is the clinical justification, tied to guideline and evidence support.  Risk of progression to overt diabetes & complications Prediabetes is a high?risk state. The transition rate from prediabetes to type 2 diabetes is substantial--approximately 5-10?% per year in many cohorts--especially in the presence of obesity and elevated liver enzymes.  Once diabetes develops, risk of microvascular (retinopathy, nephropathy, neuropathy) and  macrovascular (MI, stroke) complications increases, even during early phases. Early interventions that reduce weight, insulin resistance, and glycemia may delay or prevent progression.  In this patient, the presence of an elevated ALT (57) and suspected fatty liver signals metabolic stress and hepatic insulin resistance, which further accelerates progression.  Thus, intervening now offers a chance to change her trajectory, reduce the "metabolic debt," and potentially prevent or delay onset of diabetes and its downstream complications.  Obesity / weight burden & failure of conservative therapy Her BMI of 33 places her clearly in the obesity category (>=?30?kg/m), which is well accepted in obesity/weight?management guidelines as an indication for pharmacotherapy when nonpharmacologic measures fail.  She has already attempted diet / lifestyle modification and has not achieved sustainable weight loss. That implies her physiology (setpoint, appetite hormones, energy expenditure adaptation) is resisting further progress via lifestyle alone.  Obesity itself is an independent risk factor for cardiovascular disease, dyslipidemia, hypertension, and fatty liver disease; weight reduction has multiple downstream benefits beyond glycemic control.  Thus, a therapy  that helps her break through the weight plateau is appropriate, rather than continuing repeated unsuccessful attempts with diet alone.  Liver (NAFLD) considerations Although she does not yet have a formal liver biopsy or staging, her elevated ALT and clinical context strongly suggest NAFLD or evolving nonalcoholic steatohepatitis (NASH).  There is growing evidence that GLP-1 receptor agonists can improve steatosis, reduce liver enzymes, reduce inflammation, and potentially slow progression of fibrosis (though the fibrosis benefit is less firmly established).  Endocrine Society +2 PubMed +2  In meta-analyses, GLP-1 RAs have been shown to reduce  hepatic fat content more than metformin or insulin therapies, and to improve ALT/AST and other biomarkers of liver injury.  [employer] +3 PubMed +3 PubMed +3  Moreover, observational data suggest GLP-1 use is associated with lower risk of liver disease progression in patients with hepatic steatosis, compared to other classes (e.g. DPP-4 inhibitors).  PubMed  Given her risk for fatty liver progression, use of GLP-1 may favorably impact both hepatic and systemic outcomes and mitigate liver injury.  Thus, her liver risk is an additional compelling indication -- it is not merely weight loss "cosmetic," but treatment of a metabolically active disease process.  Cardiovascular and mortality benefit GLP-1 RAs have been shown in multiple cardiovascular outcome trials to reduce major adverse cardiovascular events (MACE) (composite of MI, stroke, cardiovascular death) in patients with T2DM.  PubMed +2 JAMA Network +2  The mechanistic basis includes improvement in glycemic control, weight reduction, mild reductions in blood pressure, improvement in lipid parameters, anti?inflammatory and antiatherosclerotic effects.  PubMed +2 [employer] +2  A more recent meta-analysis focused on patients with obesity/overweight (not necessarily with diabetes) showed that GLP-1 therapy was associated with reduced risk of myocardial infarction (RR ~0.72) and modest decreases in blood pressure compared to placebo.  PubMed  Emerging data (e.g. from the SELECT trial) suggest that in people with overweight/obesity even without diabetes, semaglutide  2.4?mg (i.e. Wegovy ) may reduce cardiovascular events and mortality.  [employer]  In patients with higher BMI, GLP-1 agents appear to retain or even amplify cardiovascular benefit.  JAMA Network  Outside of overt diabetes, society guidelines and cardiology literature are beginning to consider GLP-1 use for cardiovascular risk reduction in  obese patients with risk factors. For example, the ESC (European Society of Cardiology) gives a Class IIa recommendation to consider semaglutide  in overweight/obese individuals without diabetes but with chronic coronary syndrome to lower risk of MI, stroke, or CV mortality.  [employer]Link  Thus, in this patient with obesity, metabolic dysfunction, and risk factors, GLP-1 therapy may provide not only glycemic/liver/weight benefits but also cardiovascular protection before overt diabetes develops.  Why other options are insufficient or suboptimal Lifestyle intervention alone has plateaued. While diet, exercise, behavioral therapy remain the foundation and will continue in parallel, this patient's physiology has likely adapted (reduced basal metabolic rate, hormonal counter?regulation). Persisting with the same interventions has not produced durable weight loss, and further incremental gains are unlikely without adjunctive therapy.  Metformin / insulin sensitizers:  Metformin is often used in prediabetes, but its weight loss effect is modest at best and limited in reversing obesity.  It also has limited benefit in improving hepatic steatosis or fibrosis beyond modest effects.  It would not provide sufficient weight or cardiovascular benefit to alter risk trajectory in a patient with obesity + elevated ALT.  Other pharmacotherapies (e.g. older weight-loss agents, appetite suppressants):  Many older agents (e.g. phentermine, orlistat) have limited efficacy, side effect profiles, or lack of durability.  Some are contraindicated in patients with comorbidities or poorly tolerated.  None have the robust cardiovascular outcome evidence or liver/diabetes prevention evidence that GLP-1 class has amassed.  Delaying pharmacotherapy is higher risk:  If we wait until she crosses the threshold into frank diabetes, we lose the "window of opportunity" for prevention.  Intervening earlier may yield greater  cumulative benefit and reduce irreversible progression of end organ damage.  Therefore, GLP-1 therapy is not experimental in her case but a rational, guideline-consistent escalation when conservative therapy fails and risk is high.  Proposal and monitoring plan I propose initiating [specific GLP-1 agent, e.g. semaglutide  2.4?mg (Wegovy ) or other covered GLP-1] in conjunction with continued diet, exercise, and behavioral therapy.  We will monitor: weight (monthly), glycemic markers (fasting glucose, A1c every 3-6 months), liver chemistries (ALT/AST quarterly), lipid panel, renal function, pancreatic enzymes (per guidelines), and adverse effect surveillance (GI symptoms, gallbladder risk, pancreatitis).  For continuation of therapy, I will document >=?5?% weight loss (or other plan-specified threshold) at 6- or 39-month time point, along with improvements in glycemic markers, liver enzymes, and overall tolerability.  In summary, this patient's phenotype (obesity + elevated ALT + failed weight loss + high risk of diabetes) justifies a GLP-1 receptor agonist as the next step. The goals are to mitigate weight burden, reduce insulin resistance, prevent progression to T2DM, slow or reverse liver injury, and reduce cardiovascular risk. I respectfully request approval of GLP-1 therapy as medically necessary in this clinical scenario.  If helpful, I can also generate a more tailored version citing her exact labs and projected benefit numbers for your PA submission. Would you like me to do that?     Attach  Search  Study  Voice No file chosenNo file chosen ChatGPT can make mistakes. Check important info.

## 2024-02-07 ENCOUNTER — Encounter: Payer: Self-pay | Admitting: Internal Medicine

## 2024-02-07 NOTE — Assessment & Plan Note (Signed)
 Her anxiety disorder was previously managed with escitalopram , which was discontinued due to nausea and discomfort. Buspirone is considered as an alternative treatment. There was a discussion on the effects of cannabis on anxiety, highlighting its potential to exacerbate symptoms. Prescribe buspirone for anxiety management. Provide Atarax for occasional sleep disturbances. Educate on the effects of cannabis on anxiety.

## 2024-02-07 NOTE — Assessment & Plan Note (Signed)
 Obesity with prediabetes and non-alcoholic fatty liver disease   She has obesity with a BMI over 30, prediabetes, and non-alcoholic fatty liver disease, indicated by elevated ALT levels. Previous lifestyle interventions, including dietary changes and exercise, have been ineffective. Insurance coverage for GLP-1 agonists like Wegovy  is being pursued, with documentation of comorbidities and past weight management attempts. There are no contraindications such as medullary thyroid carcinoma or multiple endocrine neoplasia. A metformin trial is planned to document treatment attempts. She is open to considering bariatric surgery if necessary. Check [employer] insurance coverage for GLP-1 agonists. Document previous lifestyle interventions and comorbidities for insurance purposes. Order a dietitian consultation to support lifestyle changes. Initiate a metformin trial and monitor for nausea. Complete prior authorization for Wegovy  with supporting documentation. Schedule monthly follow-ups for weight management and side effect monitoring.

## 2024-02-07 NOTE — Patient Instructions (Signed)
 It was a pleasure seeing you today! Your health and satisfaction are our top priorities.  Caitlin Cone, MD  VISIT SUMMARY: Today, we discussed your weight management options and addressed your anxiety. We reviewed your recent lab results and family history, and we talked about potential treatments and lifestyle changes to help you achieve your health goals.  YOUR PLAN: -OBESITY WITH PREDIABETES AND NON-ALCOHOLIC FATTY LIVER DISEASE: You have a BMI over 30, which classifies as obesity, and you have prediabetes and elevated liver enzymes suggesting non-alcoholic fatty liver disease. We will explore insurance coverage for weight management medications like Wegovy  and start a trial of metformin to help manage your weight and blood sugar levels. You will also consult with a dietitian to support your lifestyle changes. Monthly follow-ups will be scheduled to monitor your progress and any side effects.  -ANXIETY DISORDER: You have an anxiety disorder that was previously managed with escitalopram , which you stopped due to side effects. We will start you on buspirone for anxiety management and provide Atarax for occasional sleep disturbances. We also discussed how cannabis can potentially worsen anxiety symptoms.  INSTRUCTIONS: Please check with your [employer] insurance about coverage for GLP-1 agonists like Wegovy . Document your previous lifestyle interventions and any comorbidities for insurance purposes. Schedule a consultation with a dietitian to support your lifestyle changes. Start the metformin trial as prescribed and monitor for any nausea. Complete the prior authorization for Wegovy  with the necessary documentation. Schedule monthly follow-ups for weight management and side effect monitoring. Begin taking buspirone for anxiety and use Atarax as needed for sleep disturbances. Avoid using cannabis as it may worsen your anxiety.  Your Providers PCP: Patrick Caitlin MATSU, MD,  [phone]) Referring Provider:  Cone Caitlin MATSU, MD,  [phone])  NEXT STEPS: [x]  Early Intervention: Schedule sooner appointment, call our on-call services, or go to emergency room if there is any significant Increase in pain or discomfort New or worsening symptoms Sudden or severe changes in your health [x]  Flexible Follow-Up: We recommend a No follow-ups on file. for optimal routine care. This allows for progress monitoring and treatment adjustments. [x]  Preventive Care: Schedule your annual preventive care visit! It's typically covered by insurance and helps identify potential health issues early. [x]  Lab & X-ray Appointments: Incomplete tests scheduled today, or call to schedule. X-rays: [facility] (M-F, 8:30am-noon or 1pm-5pm). [x]  Medical Information Release: Sign a release form at front desk to obtain relevant medical information we don't have.  MAKING THE MOST OF OUR FOCUSED 20 MINUTE APPOINTMENTS: [x]   Clearly state your top concerns at the beginning of the visit to focus our discussion [x]   If you anticipate you will need more time, please inform the front desk during scheduling - we can book multiple appointments in the same week. [x]   If you have transportation problems- use our convenient video appointments or ask about transportation support. [x]   We can get down to business faster if you use MyChart to update information before the visit and submit non-urgent questions before your visit. Thank you for taking the time to provide details through MyChart.  Let our nurse know and she can import this information into your encounter documents.  Arrival and Wait Times: [x]   Arriving on time ensures that everyone receives prompt attention. [x]   Early morning (8a) and afternoon (1p) appointments tend to have shortest wait times. [x]   Unfortunately, we cannot delay appointments for late arrivals or hold slots during phone calls.  Getting Answers and Following Up [x]   Simple Questions & Concerns:  For quick questions or basic follow-up after your visit, reach us  at [phone] or MyChart messaging. [x]   Complex Concerns: If your concern is more complex, scheduling an appointment might be best. Discuss this with the staff to find the most suitable option. [x]   Lab & Imaging Results: We'll contact you directly if results are abnormal or you don't use MyChart. Most normal results will be on MyChart within 2-3 business days, with a review message from Dr. Jesus. Haven't heard back in 2 weeks? Need results sooner? Contact us  at [phone]. [x]   Referrals: Our referral coordinator will manage specialist referrals. The specialist's office should contact you within 2 weeks to schedule an appointment. Call us  if you haven't heard from them after 2 weeks.  Staying Connected [x]   MyChart: Activate your MyChart for the fastest way to access results and message us . See the last page of this paperwork for instructions on how to activate.  Bring to Your Next Appointment [x]   Medications: Please bring all your medication bottles to your next appointment to ensure we have an accurate record of your prescriptions. [x]   Health Diaries: If you're monitoring any health conditions at home, keeping a diary of your readings can be very helpful for discussions at your next appointment.  Billing [x]   X-ray & Lab Orders: These are billed by separate companies. Contact the invoicing company directly for questions or concerns. [x]   Visit Charges: Discuss any billing inquiries with our administrative services team.  Your Satisfaction Matters [x]   Share Your Experience: We strive for your satisfaction! If you have any complaints, or preferably compliments, please let Dr. Jesus know directly or contact our Practice Administrators, Manuelita Rubin or [employer], by asking at the front desk.   Reviewing Your Records [x]   Review this early draft of your clinical encounter notes below and the final encounter  summary tomorrow on MyChart after its been completed.  All orders placed so far are visible here: NAFLD (nonalcoholic fatty liver disease) -     Amb ref to Medical Nutrition Therapy-MNT -     metFORMIN HCl; Take 1 tablet (500 mg total) by mouth 2 (two) times daily with a meal.  Dispense: 180 tablet; Refill: 3 -     Wegovy ; Inject 0.25 mg into the skin once a week.  Dispense: 2 mL; Refill: 2 -     busPIRone HCl; Take 1 tablet (5 mg total) by mouth 2 (two) times daily.  Dispense: 180 tablet; Refill: 3 -     hydrOXYzine Pamoate; Take 1 capsule (25 mg total) by mouth every 8 (eight) hours as needed.  Dispense: 30 capsule; Refill: 0  Obesity due to energy imbalance -     Amb ref to Medical Nutrition Therapy-MNT -     metFORMIN HCl; Take 1 tablet (500 mg total) by mouth 2 (two) times daily with a meal.  Dispense: 180 tablet; Refill: 3 -     Wegovy ; Inject 0.25 mg into the skin once a week.  Dispense: 2 mL; Refill: 2 -     busPIRone HCl; Take 1 tablet (5 mg total) by mouth 2 (two) times daily.  Dispense: 180 tablet; Refill: 3 -     hydrOXYzine Pamoate; Take 1 capsule (25 mg total) by mouth every 8 (eight) hours as needed.  Dispense: 30 capsule; Refill: 0  Prediabetes -     Amb ref to Medical Nutrition Therapy-MNT -     metFORMIN HCl; Take 1 tablet (  500 mg total) by mouth 2 (two) times daily with a meal.  Dispense: 180 tablet; Refill: 3 -     Wegovy ; Inject 0.25 mg into the skin once a week.  Dispense: 2 mL; Refill: 2 -     busPIRone HCl; Take 1 tablet (5 mg total) by mouth 2 (two) times daily.  Dispense: 180 tablet; Refill: 3 -     hydrOXYzine Pamoate; Take 1 capsule (25 mg total) by mouth every 8 (eight) hours as needed.  Dispense: 30 capsule; Refill: 0  Anxiety

## 2024-02-09 ENCOUNTER — Other Ambulatory Visit (HOSPITAL_COMMUNITY): Payer: Self-pay

## 2024-02-09 ENCOUNTER — Telehealth: Payer: Self-pay

## 2024-02-09 NOTE — Telephone Encounter (Signed)
 Pharmacy Patient Advocate Encounter   Received notification from [employer] that prior authorization for Wegovy  0.25MG /0.5ML auto-injectors is required/requested.   Insurance verification completed.   The patient is insured through [facility] MEDICAID.   Per test claim: PA required; However, NEW/RECENT labs/notes are needed to complete & submit PA request. Please see below.  Medical records (for example, chart notes, laboratory values, etc.) confirming the patient has stage F1, F2, or F3 fibrosis as confirmed by BOTH of the following: (1) A FIB-4 score consistent with stage F1, F2, or F3 fibrosis adjusted for age, (2) The patient has ONE of the following: Liver biopsy, Vibration-controlled transient elastography (VCTE), Enhanced liver fibrosis (ELF) score, or Magnetic resonance elastography (MRE)? Failure to submit medical records may lead to an adverse determination.

## 2024-02-10 ENCOUNTER — Other Ambulatory Visit (HOSPITAL_COMMUNITY): Payer: Self-pay

## 2024-02-25 ENCOUNTER — Encounter (HOSPITAL_BASED_OUTPATIENT_CLINIC_OR_DEPARTMENT_OTHER): Admitting: Family Medicine

## 2024-03-02 ENCOUNTER — Telehealth: Admitting: Physician Assistant

## 2024-03-02 DIAGNOSIS — H6011 Cellulitis of right external ear: Secondary | ICD-10-CM

## 2024-03-03 MED ORDER — CEPHALEXIN 500 MG PO CAPS: 500.0000 mg | ORAL_CAPSULE | Freq: Four times a day (QID) | ORAL | 0 refills | Status: DC

## 2024-03-03 NOTE — Progress Notes (Signed)

## 2024-03-10 ENCOUNTER — Encounter: Admitting: Dietician

## 2024-03-11 ENCOUNTER — Ambulatory Visit: Admitting: Internal Medicine

## 2024-03-17 ENCOUNTER — Encounter: Payer: Self-pay | Admitting: Internal Medicine

## 2024-03-17 ENCOUNTER — Ambulatory Visit: Admitting: Internal Medicine

## 2024-03-17 VITALS — BP 118/74 | HR 90 | Temp 98.0°F | Ht 70.0 in | Wt 225.4 lb

## 2024-03-17 DIAGNOSIS — H538 Other visual disturbances: Secondary | ICD-10-CM | POA: Insufficient documentation

## 2024-03-17 DIAGNOSIS — Z8669 Personal history of other diseases of the nervous system and sense organs: Secondary | ICD-10-CM | POA: Diagnosis not present

## 2024-03-17 DIAGNOSIS — R7303 Prediabetes: Secondary | ICD-10-CM | POA: Diagnosis not present

## 2024-03-17 DIAGNOSIS — N920 Excessive and frequent menstruation with regular cycle: Secondary | ICD-10-CM

## 2024-03-17 DIAGNOSIS — E669 Obesity, unspecified: Secondary | ICD-10-CM

## 2024-03-17 DIAGNOSIS — H6690 Otitis media, unspecified, unspecified ear: Secondary | ICD-10-CM | POA: Insufficient documentation

## 2024-03-17 DIAGNOSIS — R110 Nausea: Secondary | ICD-10-CM

## 2024-03-17 DIAGNOSIS — J320 Chronic maxillary sinusitis: Secondary | ICD-10-CM

## 2024-03-17 MED ORDER — WEGOVY 0.5 MG/0.5ML ~~LOC~~ SOAJ: 0.5000 mg | SUBCUTANEOUS | 2 refills | Status: AC

## 2024-03-17 MED ORDER — FLUTICASONE PROPIONATE 50 MCG/ACT NA SUSP
2.0000 | Freq: Every day | NASAL | 6 refills | Status: AC
Start: 2024-03-17 — End: ?

## 2024-03-17 MED ORDER — AMOXICILLIN-POT CLAVULANATE 875-125 MG PO TABS: 1.0000 | ORAL_TABLET | Freq: Two times a day (BID) | ORAL | 0 refills | Status: DC

## 2024-03-17 MED ORDER — LORATADINE 10 MG PO TABS: 10.0000 mg | ORAL_TABLET | Freq: Every day | ORAL | 11 refills | Status: AC

## 2024-03-17 MED ORDER — ONDANSETRON 4 MG PO TBDP: 4.0000 mg | ORAL_TABLET | Freq: Three times a day (TID) | ORAL | 2 refills | Status: AC | PRN

## 2024-03-17 MED ORDER — SIMPLY SALINE 0.9 % NA AERS
2.0000 | INHALATION_SPRAY | NASAL | 11 refills | Status: AC
Start: 2024-03-17 — End: ?

## 2024-03-17 MED ORDER — PSEUDOEPHEDRINE HCL ER 120 MG PO TB12: 120.0000 mg | ORAL_TABLET | Freq: Two times a day (BID) | ORAL | 0 refills | Status: AC

## 2024-03-17 NOTE — Assessment & Plan Note (Signed)
 Blurry Vision (H53.8) Current status: Patient reports intermittent blurry vision, possibly related to facial oil use; overdue for eye exam. Plan: Referral to ophthalmology for diabetic/pre-diabetic retinal screening and evaluation of vision changes. Advise patient to schedule promptly.

## 2024-03-17 NOTE — Assessment & Plan Note (Signed)
 Menorrhagia with Regular Cycle (N92.0), Chronic Pelvic Pain Current status: Heavy menstrual bleeding and chronic pelvic pain. Dissatisfied with previous gynecologic care and pain management. Plan: Ambulatory referral to gynecology for comprehensive evaluation and management. Patient requests new provider; previous care at [facility] noted.  Review anemia risk; recent CBC within normal limits, but continue to monitor. Continue current prenatal vitamins; review need for additional supplementation at gynecology visit.

## 2024-03-17 NOTE — Assessment & Plan Note (Signed)
 History of Recurrent Ear Infection, Otitis Media (H66.90), Chronic Maxillary Sinusitis (J32.0) Current status: Recent severe ear infection with skin splitting, swelling, and persistent inner ear pain despite antibiotics. Suspected allergy contribution and poor sinus drainage. Exam: Bilateral ear bulging, white exudate observed. Plan:  Antibiotic: Amoxicillin -clavulanate (Augmentin ) 875-125 mg BID x 10 days. Sinus hygiene regimen:  Fluticasone  (Flonase ) nasal spray, saline nasal rinse (Simply Saline), loratadine  (Claritin ) daily, pseudoephedrine  (Sudafed) BID. Patient instructed on sinus rinse technique and importance of regular use to promote drainage. ENT referral: If symptoms persist or recur despite maximal medical therapy, refer to otolaryngology for further evaluation and possible procedural intervention. Follow-up: Monitor for resolution of pain, swelling, and drainage; advise to return if symptoms worsen.

## 2024-03-17 NOTE — Patient Instructions (Addendum)
 Summary of Today's Visit: You were seen today to discuss concerns about: Weight management and medication costs (Wegovy ) Recurrent ear infections and sinus issues Heavy menstrual bleeding and pelvic pain Blurry vision We reviewed your medications, recent lab results, and discussed next steps for your ongoing health concerns.  Key Updates & Next Steps: 1. Weight Management & Prediabetes Your A1c is 5.9, which means you have prediabetes (not diabetes). Continue Wegovy  (semaglutide ) for weight management as insurance allows. We will try to find more affordable options for you. Keep taking metformin 500 mg twice daily as prescribed, and work toward consistent daily use. Keep up with your healthy eating and exercise habits--walking daily and home exercise classes are excellent! 2. Ear Infection & Sinus Health You have a recurrent ear infection and signs of chronic sinus issues. You were prescribed:  Augmentin  (antibiotic) Flonase  (nasal spray) Simply Saline (nasal rinse) Claritin  (allergy medication) Sudafed (decongestant) Use the saline rinse and nasal spray as directed to help clear your sinuses and prevent future infections. If your symptoms do not improve or get worse, let us  know. You may need a referral to an ENT specialist. 3. Blurry Vision You reported blurry vision and are overdue for an eye exam. A referral to ophthalmology has been placed. Please schedule an appointment soon. 4. Heavy Menstrual Bleeding & Pelvic Pain You continue to have significant menstrual bleeding and pain. A referral to gynecology has been placed so you can see a new provider for further evaluation and better pain management. 5. Nausea If you experience nausea, you may use Zofran  (ondansetron ) as needed. Take your medications with food when possible.  Medications Prescribed or Continued Today: Wegovy  (semaglutide ) injection (as insurance allows) Metformin 500 mg twice daily Augmentin   (amoxicillin -clavulanate) for ear/sinus infection Flonase  nasal spray Simply Saline nasal rinse Claritin  (loratadine ) daily Sudafed (pseudoephedrine ) as directed Zofran  (ondansetron ) for nausea as needed Continue prenatal vitamins  Referrals Made: Ophthalmology (eye doctor) Gynecology (women's health specialist) Nutrition (dietitian--appointment rescheduled for December)  When to Contact Us : If you have worsening pain, fever, swelling, or drainage from your ear If your vision changes suddenly or you have eye pain If your symptoms do not improve after finishing your medications For any questions about your medications or insurance coverage If you have heavy bleeding or severe pelvic pain  Follow-up: Return to clinic in 4-6 weeks, or sooner if needed. Send us  updates on your progress or questions via MyChart.  Thank you for being actively involved in your care. Keep up the good work with your lifestyle changes, and let us  know if you need any help with medications or referrals!  Questions? Call the office or message us  through MyChart.  This summary is for your records. Please bring it with you to all appointments and share with other providers as needed.   ALLERGY MANAGEMENT PLAN  This plan is designed to help manage your allergic rhinitis (nasal allergies) effectively. Follow these steps daily for best results.  Sinus saline sprays- use nightly, and after sneezing episodes or exposure to allergen.  Insert deeply and spray mist into nose while leaning over sink at 45 degrees,  while gently breathing. Also blow out onto tissue while leaning forward 45 degrees. Once daily, after a sinus rinse, use sensimist.  Just before bedtime is best. This only needed if allergies acting up.  If this is inadequate add-on once daily for levocetirizine / xyzal 5 mg for nondrowsy antihistamine Take benadryl 25 mg at bedtime also if allergic mucus is persisting  When allergies cause chronic  swelling in sinuses, it leads to sinus infections:    DAILY TREATMENT ROUTINE   Time of Day Treatment Steps  Morning 1. Saline Nasal Spray - Use to cleanse nasal passages 2. Xyzal (levocetirizine) - Take one tablet daily   Throughout Day Saline Nasal Spray - Use 2 additional times (mid-day and afternoon)   Evening/Bedtime 1. Saline Nasal Rinse - Thoroughly clean nasal passages 2. Flonase  Sensimist - Apply after nasal rinse 3. Benadryl (diphenhydramine) - Take 25mg  if experiencing persistent congestion    PROPER TECHNIQUE GUIDE       Saline Nasal Spray/Rinse Technique: Lean forward over sink at a 45-degree angle Turn head slightly to one side Insert spray tip into upper nostril Spray gently while breathing lightly through your nose Repeat on other side Gently blow nose to clear excess solution Use saline spray 3 times daily to keep nasal passages moist and clear allergens.       Flonase  Sensimist Technique: Shake bottle gently before each use Prime the bottle if it's new or hasn't been used for a week Tilt your head forward slightly Insert tip into nostril, pointing away from the center of your nose Spray while inhaling gently Repeat in other nostril Use Flonase  Sensimist once daily, preferably at bedtime after using saline rinse. It may take several days of regular use to feel maximum benefit.   WHY FLONASE  SENSIMIST?   Benefits of Flonase  Sensimist:  Alcohol-free and scent-free formula - gentler on sensitive nasal passages Fine mist application - more comfortable with less dripping down throat Effectively relieves nasal congestion, sneezing, runny nose, and even eye symptoms 24-hour relief with once-daily dosing Uses a more potent form of fluticasone  that works at a lower dose Less liquid per spray means less discomfort  UNDERSTANDING YOUR MEDICATIONS   Medication How It Works Important Notes  Flonase  Sensimist (fluticasone  furoate) Reduces inflammation in nasal  passages, addressing the underlying cause of allergy symptoms - Takes several days for full effect - Use daily for best results - Safe for long-term use   Xyzal (levocetirizine) Blocks histamine to reduce allergy symptoms like sneezing and itching - Take at the same time each day - May cause drowsiness in some people - Once-daily dosing   Benadryl (diphenhydramine) Antihistamine that provides additional relief for breakthrough symptoms - Causes drowsiness - Use only at bedtime - For occasional use when needed   Saline Spray/Rinse Physically removes allergens and moistens nasal passages - Safe to use frequently - Improves effectiveness of other treatments - Reduces nasal irritation    CONTACT YOUR PROVIDER IF: Your symptoms do not improve after 1-2 weeks of following this plan You develop sinus pain with fever or green/yellow discharge You experience frequent nosebleeds You develop new or worsening symptoms You have questions about your treatment plan     ADDITIONAL ALLERGY MANAGEMENT TIPS   HELPFUL STRATEGIES: ?? Keep windows closed during high pollen seasons ??? Use allergen-proof covers for pillows and mattresses ?? Vacuum regularly with a HEPA filter vacuum ?? Shower and change clothes after spending time outdoors ?? Check local pollen counts and limit outdoor time when counts are high ?? Stay well-hydrated to help keep mucous membranes moist

## 2024-03-17 NOTE — Assessment & Plan Note (Signed)
 Obesity (E66.9), Prediabetes (R73.03) Current status: BMI 32.3. A1c 5.9 (prediabetes). Patient is motivated, walking daily, participating in exercise classes, and avoiding sweets/sugary drinks. Medication: Wegovy  (semaglutide ) titrated to 0.35 mg weekly; currently self-paying due to lack of insurance coverage for weight loss indication.  Plan: Attempt to secure insurance coverage for Wegovy ; if denied, continue to explore manufacturer programs and pharmacy discount options. Continue current dose and titrate as tolerated. Metformin: Currently taking 500 mg BID inconsistently; did not increase to 3 tablets daily. No longer indicated to titrate up, as diabetes was ruled out; reinforce adherence to current dose for prediabetes/weight management. Lifestyle: Encourage continuation of physical activity and dietary modifications. Referral to nutritionist confirmed and rescheduled. Follow-up: Recheck weight, A1c, and adherence at next visit. Continue to monitor for side effects (noted mild nausea).

## 2024-03-17 NOTE — Progress Notes (Signed)
 ==============================  [facility]: [phone]   -- Medical Office Visit --  Patient: Caitlin Patrick      Age: 38 y.o.       Sex:  female  Date:   03/17/2024 Today's Healthcare Provider: Bernardino KANDICE Cone, MD  ==============================   Chief Complaint: Obesity (Was not able to get wegovy  online was about afford it ), Ear Pain (Has infection still painful), and Menorrhagia (Pt is having a lot of bleeding and painful at times would like to go see obgyn for this )  Discussed the use of AI scribe software for clinical note transcription with the patient, who gave verbal consent to proceed.  History of Present Illness 38 year old female who presents with concerns about medication costs and recurrent ear infections. She has been managing her weight with Wegovy , starting at a low dose of 0.2 mg and titrating up to 0.35 mg. Due to insurance coverage issues, she has been paying out-of-pocket, with costs totaling over $300 monthly. She recently switched from [employer] to [employer], which only covers Wegovy  for type 2 diabetes, a condition she does not have. She was told at first that she had type 2 diabetes, but this was later clarified to prediabetes with an A1c of 5.9. She is currently taking metformin, two pills a day, but admits to inconsistent adherence. She has not yet increased her metformin dose to three pills a day. She also takes prenatal vitamins as a supplement. She experiences blurry vision, which she initially attributed to oil getting into her eyes. She is overdue for an eye examination. She has a history of recurrent ear infections, with the most recent occurring last week. She describes severe pain, swelling, and skin splitting on the outside of her ear, with faint pain persisting in the inner ear despite completing a course of antibiotics. She suspects allergies may be contributing to the recurrence. She experiences heavy  menstrual bleeding and has been dissatisfied with previous gynecological care, feeling that her pain was not taken seriously and that she was not provided adequate pain management. She is seeking a new gynecologist for further evaluation. In terms of lifestyle, she has been actively working on her health by walking daily and participating in exercise classes at home. She has been avoiding sweets and sugary drinks as part of her efforts to manage her prediabetes. No sleep apnea or waking up from snoring.  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 Past Medical History:  has a past medical history of DVT (deep venous thrombosis) (HCC), Sickle cell trait, Uterine leiomyoma (04/29/2022), and Vaginal Pap smear, abnormal. Past Surgical History:   has a past surgical history that includes Wisdom tooth extraction; Multiple tooth extractions (Left); Cesarean section; and Carpal tunnel release (Right, 05/01/2021). Social History:   reports that she has quit smoking. She has been exposed to tobacco smoke. She has never used smokeless tobacco. She reports that she does not currently use  alcohol. She reports that she does not currently use drugs after having used the following drugs: Marijuana. Family History:  family history includes Breast cancer in her mother; Cancer in her mother;  Diabetes in her maternal grandmother and paternal grandmother; Hypertension in her father. Allergies:  has no known allergies.   Medication Reconciliation: Current Outpatient Medications on File Prior to Visit  Medication Sig   hydrOXYzine (VISTARIL) 25 MG capsule Take 1 capsule (25 mg total) by mouth every 8 (eight) hours as needed.   metFORMIN (GLUCOPHAGE) 500 MG tablet Take 1 tablet (500 mg total) by mouth 2 (two) times daily with a meal.   busPIRone (BUSPAR) 5 MG tablet Take 1 tablet (5 mg total) by mouth 2 (two) times daily. (Patient not taking: Reported on 03/17/2024)   cephALEXin  (KEFLEX ) 500 MG capsule Take 1 capsule (500 mg total) by mouth 4 (four) times daily. (Patient not taking: Reported on 03/17/2024)   No current facility-administered medications on file prior to visit.   Medications Discontinued During This Encounter  Medication Reason   semaglutide -weight management (WEGOVY ) 0.25 MG/0.5ML SOAJ SQ injection      Physical Exam:    03/17/2024    8:59 AM 02/06/2024    9:05 AM 12/23/2023   10:24 AM  Vitals with BMI  Height 5' 10 5' 10 5' 10  Weight 225 lbs 6 oz 231 lbs 237 lbs 10 oz  BMI 32.34 33.15 34.09  Systolic 118 120 879  Diastolic 74 80 76  Pulse 90 90 68  Vital signs reviewed.  Nursing notes reviewed. Weight trend reviewed. Physical Activity: Insufficiently Active (02/14/2023)   Exercise Vital Sign    Days of Exercise per Week: 3 days    Minutes of Exercise per Session: 30 min   General Appearance:  No acute distress appreciable.   Well-groomed, healthy-appearing female.  Well proportioned with no abnormal fat distribution.  Good muscle tone. Pulmonary:  Normal work of breathing at rest, no respiratory distress apparent. SpO2: 98 %  Musculoskeletal: All extremities are intact.  Neurological:  Awake, alert, oriented, and engaged.  No obvious focal neurological deficits or cognitive impairments.  Sensorium seems unclouded.   Speech is clear and coherent with  logical content. Psychiatric:  Appropriate mood, pleasant and cooperative demeanor, thoughtful and engaged during the exam      02/06/2024    9:04 AM 12/23/2023   10:29 AM 06/25/2023    8:10 AM 02/24/2023    9:56 AM  PHQ 2/9 Scores  PHQ - 2 Score 0 0 0 0  PHQ- 9 Score   0  0      Data saved with a previous flowsheet row definition   Office Visit on 12/23/2023  Component Date Value Ref Range Status   WBC 12/23/2023 7.5  4.0 - 10.5 K/uL Final   RBC 12/23/2023 4.43  3.87 - 5.11 Mil/uL Final   Hemoglobin 12/23/2023 12.4  12.0 - 15.0 g/dL Final   HCT 90/97/7974 37.4  36.0 - 46.0 % Final   MCV 12/23/2023 84.6  78.0 - 100.0 fl Final   MCHC 12/23/2023 33.1  30.0 - 36.0 g/dL Final   RDW 90/97/7974 14.5  11.5 - 15.5 % Final   Platelets 12/23/2023 300.0  150.0 - 400.0 K/uL Final   Neutrophils Relative % 12/23/2023 49.7  43.0 - 77.0 % Final   Lymphocytes Relative 12/23/2023 41.2  12.0 - 46.0 % Final   Monocytes Relative 12/23/2023 7.4  3.0 - 12.0 % Final   Eosinophils Relative  12/23/2023 1.3  0.0 - 5.0 % Final   Basophils Relative 12/23/2023 0.4  0.0 - 3.0 % Final   Neutro Abs 12/23/2023 3.7  1.4 - 7.7 K/uL Final   Lymphs Abs 12/23/2023 3.1  0.7 - 4.0 K/uL Final   Monocytes Absolute 12/23/2023 0.6  0.1 - 1.0 K/uL Final   Eosinophils Absolute 12/23/2023 0.1  0.0 - 0.7 K/uL Final   Basophils Absolute 12/23/2023 0.0  0.0 - 0.1 K/uL Final   Sodium 12/23/2023 140  135 - 145 mEq/L Final   Potassium 12/23/2023 3.9  3.5 - 5.1 mEq/L Final   Chloride 12/23/2023 104  96 - 112 mEq/L Final   CO2 12/23/2023 24  19 - 32 mEq/L Final   Glucose, Bld 12/23/2023 77  70 - 99 mg/dL Final   BUN 90/97/7974 8  6 - 23 mg/dL Final   Creatinine, Ser 12/23/2023 0.67  0.40 - 1.20 mg/dL Final   Total Bilirubin 12/23/2023 0.3  0.2 - 1.2 mg/dL Final   Alkaline Phosphatase 12/23/2023 75  39 - 117 U/L Final   AST 12/23/2023 22  0 - 37 U/L Final   ALT 12/23/2023 22  0 - 35 U/L Final   Total Protein 12/23/2023 7.4  6.0 -  8.3 g/dL Final   Albumin 90/97/7974 4.0  3.5 - 5.2 g/dL Final   GFR 90/97/7974 110.66  >60.00 mL/min Final   Calcium 12/23/2023 8.7  8.4 - 10.5 mg/dL Final   Cholesterol 90/97/7974 150  0 - 200 mg/dL Final   Triglycerides 90/97/7974 64.0  0.0 - 149.0 mg/dL Final   HDL 90/97/7974 45.90  >39.00 mg/dL Final   VLDL 90/97/7974 12.8  0.0 - 40.0 mg/dL Final   LDL Cholesterol 12/23/2023 91  0 - 99 mg/dL Final   Total CHOL/HDL Ratio 12/23/2023 3   Final   NonHDL 12/23/2023 104.21   Final   Hgb A1c MFr Bld 12/23/2023 5.9  4.6 - 6.5 % Final   TSH 12/23/2023 1.820  0.450 - 4.500 uIU/mL Final  Orders Only on 06/25/2023  Component Date Value Ref Range Status   Total Protein 06/25/2023 7.0  6.0 - 8.5 g/dL Final   Albumin 96/94/7974 4.0  3.9 - 4.9 g/dL Final   Bilirubin Total 06/25/2023 0.2  0.0 - 1.2 mg/dL Final   Bilirubin, Direct 06/25/2023 0.10  0.00 - 0.40 mg/dL Final   Alkaline Phosphatase 06/25/2023 99  44 - 121 IU/L Final   AST 06/25/2023 17  0 - 40 IU/L Final   ALT 06/25/2023 14  0 - 32 IU/L Final  Orders Only on 02/10/2023  Component Date Value Ref Range Status   WBC 02/10/2023 7.3  3.4 - 10.8 x10E3/uL Final   RBC 02/10/2023 4.00  3.77 - 5.28 x10E6/uL Final   Hemoglobin 02/10/2023 11.4  11.1 - 15.9 g/dL Final   Hematocrit 89/78/7975 34.8  34.0 - 46.6 % Final   MCV 02/10/2023 87  79 - 97 fL Final   MCH 02/10/2023 28.5  26.6 - 33.0 pg Final   MCHC 02/10/2023 32.8  31.5 - 35.7 g/dL Final   RDW 89/78/7975 13.7  11.7 - 15.4 % Final   Platelets 02/10/2023 308  150 - 450 x10E3/uL Final   Neutrophils 02/10/2023 48  Not Estab. % Final   Lymphs 02/10/2023 43  Not Estab. % Final   Monocytes 02/10/2023 7  Not Estab. % Final   Eos 02/10/2023 2  Not Estab. % Final   Basos 02/10/2023 0  Not Estab. % Final  Neutrophils Absolute 02/10/2023 3.5  1.4 - 7.0 x10E3/uL Final   Lymphocytes Absolute 02/10/2023 3.1  0.7 - 3.1 x10E3/uL Final   Monocytes Absolute 02/10/2023 0.5  0.1 - 0.9 x10E3/uL Final    EOS (ABSOLUTE) 02/10/2023 0.1  0.0 - 0.4 x10E3/uL Final   Basophils Absolute 02/10/2023 0.0  0.0 - 0.2 x10E3/uL Final   Immature Granulocytes 02/10/2023 0  Not Estab. % Final   Immature Grans (Abs) 02/10/2023 0.0  0.0 - 0.1 x10E3/uL Final   Glucose 02/10/2023 74  70 - 99 mg/dL Final   BUN 89/78/7975 10  6 - 20 mg/dL Final   Creatinine, Ser 02/10/2023 0.76  0.57 - 1.00 mg/dL Final   eGFR 89/78/7975 103  >59 mL/min/1.73 Final   BUN/Creatinine Ratio 02/10/2023 13  9 - 23 Final   Sodium 02/10/2023 142  134 - 144 mmol/L Final   Potassium 02/10/2023 4.2  3.5 - 5.2 mmol/L Final   Chloride 02/10/2023 104  96 - 106 mmol/L Final   CO2 02/10/2023 21  20 - 29 mmol/L Final   Calcium 02/10/2023 9.8  8.7 - 10.2 mg/dL Final   Total Protein 89/78/7975 7.1  6.0 - 8.5 g/dL Final   Albumin 89/78/7975 4.2  3.9 - 4.9 g/dL Final   Globulin, Total 02/10/2023 2.9  1.5 - 4.5 g/dL Final   Bilirubin Total 02/10/2023 <0.2  0.0 - 1.2 mg/dL Final   Alkaline Phosphatase 02/10/2023 110  44 - 121 IU/L Final   AST 02/10/2023 33  0 - 40 IU/L Final   ALT 02/10/2023 57 (H)  0 - 32 IU/L Final   Cholesterol, Total 02/10/2023 163  100 - 199 mg/dL Final   Triglycerides 89/78/7975 92  0 - 149 mg/dL Final   HDL 89/78/7975 50  >39 mg/dL Final   VLDL Cholesterol Cal 02/10/2023 17  5 - 40 mg/dL Final   LDL Chol Calc (NIH) 02/10/2023 96  0 - 99 mg/dL Final   Chol/HDL Ratio 02/10/2023 3.3  0.0 - 4.4 ratio Final   Hgb A1c MFr Bld 02/10/2023 5.4  4.8 - 5.6 % Final   Est. average glucose Bld gHb Est-m* 02/10/2023 108  mg/dL Final   TSH 89/78/7975 1.310  0.450 - 4.500 uIU/mL Final  Office Visit on 10/07/2022  Component Date Value Ref Range Status   WBC 10/07/2022 6.9  3.4 - 10.8 x10E3/uL Final   RBC 10/07/2022 4.32  3.77 - 5.28 x10E6/uL Final   Hemoglobin 10/07/2022 12.4  11.1 - 15.9 g/dL Final   Hematocrit 93/82/7975 37.4  34.0 - 46.6 % Final   MCV 10/07/2022 87  79 - 97 fL Final   MCH 10/07/2022 28.7  26.6 - 33.0 pg Final   MCHC  10/07/2022 33.2  31.5 - 35.7 g/dL Final   RDW 93/82/7975 13.0  11.7 - 15.4 % Final   Platelets 10/07/2022 341  150 - 450 x10E3/uL Final   Neutrophils 10/07/2022 51  Not Estab. % Final   Lymphs 10/07/2022 39  Not Estab. % Final   Monocytes 10/07/2022 8  Not Estab. % Final   Eos 10/07/2022 2  Not Estab. % Final   Basos 10/07/2022 0  Not Estab. % Final   Neutrophils Absolute 10/07/2022 3.5  1.4 - 7.0 x10E3/uL Final   Lymphocytes Absolute 10/07/2022 2.7  0.7 - 3.1 x10E3/uL Final   Monocytes Absolute 10/07/2022 0.6  0.1 - 0.9 x10E3/uL Final   EOS (ABSOLUTE) 10/07/2022 0.1  0.0 - 0.4 x10E3/uL Final   Basophils Absolute 10/07/2022 0.0  0.0 - 0.2 x10E3/uL Final   Immature Granulocytes 10/07/2022 0  Not Estab. % Final   Immature Grans (Abs) 10/07/2022 0.0  0.0 - 0.1 x10E3/uL Final   Total Iron Binding Capacity 10/07/2022 326  250 - 450 ug/dL Final   UIBC 93/82/7975 274  131 - 425 ug/dL Final   Iron 93/82/7975 52  27 - 159 ug/dL Final   Iron Saturation 10/07/2022 16  15 - 55 % Final   Ferritin 10/07/2022 51  15 - 150 ng/mL Final  Admission on 09/16/2022, Discharged on 09/16/2022  Component Date Value Ref Range Status   WBC 09/16/2022 6.6  4.0 - 10.5 K/uL Final   RBC 09/16/2022 4.12  3.87 - 5.11 MIL/uL Final   Hemoglobin 09/16/2022 11.9 (L)  12.0 - 15.0 g/dL Final   HCT 94/72/7975 34.7 (L)  36.0 - 46.0 % Final   MCV 09/16/2022 84.2  80.0 - 100.0 fL Final   MCH 09/16/2022 28.9  26.0 - 34.0 pg Final   MCHC 09/16/2022 34.3  30.0 - 36.0 g/dL Final   RDW 94/72/7975 13.1  11.5 - 15.5 % Final   Platelets 09/16/2022 304  150 - 400 K/uL Final   nRBC 09/16/2022 0.0  0.0 - 0.2 % Final   Sodium 09/16/2022 139  135 - 145 mmol/L Final   Potassium 09/16/2022 3.9  3.5 - 5.1 mmol/L Final   Chloride 09/16/2022 104  98 - 111 mmol/L Final   CO2 09/16/2022 29  22 - 32 mmol/L Final   Glucose, Bld 09/16/2022 81  70 - 99 mg/dL Final   BUN 94/72/7975 6  6 - 20 mg/dL Final   Creatinine, Ser 09/16/2022 0.65  0.44 -  1.00 mg/dL Final   Calcium 94/72/7975 9.2  8.9 - 10.3 mg/dL Final   GFR, Estimated 09/16/2022 >60  >60 mL/min Final   Anion gap 09/16/2022 6  5 - 15 Final   Troponin I (High Sensitivity) 09/16/2022 2  <18 ng/L Final  Admission on 09/11/2022, Discharged on 09/11/2022  Component Date Value Ref Range Status   Sodium 09/11/2022 140  135 - 145 mmol/L Final   Potassium 09/11/2022 3.6  3.5 - 5.1 mmol/L Final   Chloride 09/11/2022 105  98 - 111 mmol/L Final   CO2 09/11/2022 28  22 - 32 mmol/L Final   Glucose, Bld 09/11/2022 92  70 - 99 mg/dL Final   BUN 94/77/7975 6  6 - 20 mg/dL Final   Creatinine, Ser 09/11/2022 0.67  0.44 - 1.00 mg/dL Final   Calcium 94/77/7975 9.5  8.9 - 10.3 mg/dL Final   GFR, Estimated 09/11/2022 >60  >60 mL/min Final   Anion gap 09/11/2022 7  5 - 15 Final   WBC 09/11/2022 8.0  4.0 - 10.5 K/uL Final   RBC 09/11/2022 4.11  3.87 - 5.11 MIL/uL Final   Hemoglobin 09/11/2022 11.9 (L)  12.0 - 15.0 g/dL Final   HCT 94/77/7975 34.5 (L)  36.0 - 46.0 % Final   MCV 09/11/2022 83.9  80.0 - 100.0 fL Final   MCH 09/11/2022 29.0  26.0 - 34.0 pg Final   MCHC 09/11/2022 34.5  30.0 - 36.0 g/dL Final   RDW 94/77/7975 13.2  11.5 - 15.5 % Final   Platelets 09/11/2022 308  150 - 400 K/uL Final   nRBC 09/11/2022 0.0  0.0 - 0.2 % Final  No image results found. No results found.       ASSESSMENT & PLAN  Summary: 38 year old female with obesity (BMI 32.3),  prediabetes (A1c 5.9), recurrent otitis media, chronic maxillary sinusitis, menorrhagia, and blurry vision. She is actively engaged in lifestyle modifications and is seeking improved medication access and specialty care. Assessment & Plan Prediabetes Obesity (BMI 30-39.9) Obesity (E66.9), Prediabetes (R73.03) Current status: BMI 32.3. A1c 5.9 (prediabetes). Patient is motivated, walking daily, participating in exercise classes, and avoiding sweets/sugary drinks. Medication: Wegovy  (semaglutide ) titrated to 0.35 mg weekly; currently  self-paying due to lack of insurance coverage for weight loss indication.  Plan: Attempt to secure insurance coverage for Wegovy ; if denied, continue to explore manufacturer programs and pharmacy discount options. Continue current dose and titrate as tolerated. Metformin: Currently taking 500 mg BID inconsistently; did not increase to 3 tablets daily. No longer indicated to titrate up, as diabetes was ruled out; reinforce adherence to current dose for prediabetes/weight management. Lifestyle: Encourage continuation of physical activity and dietary modifications. Referral to nutritionist confirmed and rescheduled. Follow-up: Recheck weight, A1c, and adherence at next visit. Continue to monitor for side effects (noted mild nausea). Blurry vision Blurry Vision (H53.8) Current status: Patient reports intermittent blurry vision, possibly related to facial oil use; overdue for eye exam. Plan: Referral to ophthalmology for diabetic/pre-diabetic retinal screening and evaluation of vision changes. Advise patient to schedule promptly. History of recurrent ear infection Otitis media, unspecified laterality, unspecified otitis media type Chronic maxillary sinusitis History of Recurrent Ear Infection, Otitis Media (H66.90), Chronic Maxillary Sinusitis (J32.0) Current status: Recent severe ear infection with skin splitting, swelling, and persistent inner ear pain despite antibiotics. Suspected allergy contribution and poor sinus drainage. Exam: Bilateral ear bulging, white exudate observed. Plan:  Antibiotic: Amoxicillin -clavulanate (Augmentin ) 875-125 mg BID x 10 days. Sinus hygiene regimen:  Fluticasone  (Flonase ) nasal spray, saline nasal rinse (Simply Saline), loratadine  (Claritin ) daily, pseudoephedrine  (Sudafed) BID. Patient instructed on sinus rinse technique and importance of regular use to promote drainage. ENT referral: If symptoms persist or recur despite maximal medical therapy, refer to  otolaryngology for further evaluation and possible procedural intervention. Follow-up: Monitor for resolution of pain, swelling, and drainage; advise to return if symptoms worsen. Menorrhagia with regular cycle Menorrhagia with Regular Cycle (N92.0), Chronic Pelvic Pain Current status: Heavy menstrual bleeding and chronic pelvic pain. Dissatisfied with previous gynecologic care and pain management. Plan: Ambulatory referral to gynecology for comprehensive evaluation and management. Patient requests new provider; previous care at [facility] noted.  Review anemia risk; recent CBC within normal limits, but continue to monitor. Continue current prenatal vitamins; review need for additional supplementation at gynecology visit. Nausea Nausea (R11.0) Current status: Intermittent nausea, possibly related to Wegovy  and/or metformin. Plan: Ondansetron  (Zofran  ODT) 4 mg PRN for nausea. Reinforce medication timing with food and slow titration as tolerated.  Additional Orders/Instructions Medication reconciliation: Discontinue Wegovy  if insurance coverage is secured for alternative medication, or if patient cannot afford ongoing out-of-pocket costs. Sleep: No evidence of sleep apnea or snoring; advised to monitor sleep quality and report any changes. Vitamin D : Not currently taking; consider supplementation pending further evaluation. Social/Behavioral: Patient has quit smoking, no current alcohol or drug use. Support continued lifestyle changes and stress management. Patient education: Reviewed diagnosis, medication safety, side effects, and self-management strategies. Patient actively participated in shared decision-making and documentation review. Follow-up: Return to clinic in 4-6 weeks or sooner if symptoms worsen or new concerns arise. Reach out via MyChart for ongoing support and medication issues.  Referrals: Ophthalmology (vision changes) Gynecology (menorrhagia, pelvic pain) Nutrition  (pre-diabetes/weight management) ENT (if otitis media/sinusitis persists) Patient Instructions: Contact office for any worsening symptoms,  medication side effects, or insurance/pharmacy issues. Return to clinic or ED for severe pain, vision changes, or signs of infection.  ORDER ASSOCIATIONS  #   DIAGNOSIS / CONDITION ICD-10 ENCOUNTER ORDER     ICD-10-CM   1. Prediabetes  R73.03 semaglutide -weight management (WEGOVY ) 0.5 MG/0.5ML SOAJ SQ injection    Amb ref to Medical Nutrition Therapy-MNT    2. Blurry vision  H53.8 Ambulatory referral to Ophthalmology    3. History of recurrent ear infection  Z86.69 Ambulatory referral to ENT    4. Otitis media, unspecified laterality, unspecified otitis media type  H66.90 amoxicillin -clavulanate (AUGMENTIN ) 875-125 MG tablet    fluticasone  (FLONASE ) 50 MCG/ACT nasal spray    Saline (SIMPLY SALINE) 0.9 % AERS    loratadine  (CLARITIN ) 10 MG tablet    pseudoephedrine  (SUDAFED 12 HOUR) 120 MG 12 hr tablet    5. Menorrhagia with regular cycle  N92.0 Ambulatory referral to Gynecology    6. Chronic maxillary sinusitis  J32.0 amoxicillin -clavulanate (AUGMENTIN ) 875-125 MG tablet    fluticasone  (FLONASE ) 50 MCG/ACT nasal spray    Saline (SIMPLY SALINE) 0.9 % AERS    loratadine  (CLARITIN ) 10 MG tablet    pseudoephedrine  (SUDAFED 12 HOUR) 120 MG 12 hr tablet    Ambulatory referral to ENT    7. Obesity (BMI 30-39.9)  E66.9 semaglutide -weight management (WEGOVY ) 0.5 MG/0.5ML SOAJ SQ injection    Amb ref to Medical Nutrition Therapy-MNT    8. Nausea  R11.0 ondansetron  (ZOFRAN -ODT) 4 MG disintegrating tablet      Referral Orders         Ambulatory referral to Gynecology         Ambulatory referral to Ophthalmology         Amb ref to Medical Nutrition Therapy-MNT         Ambulatory referral to ENT     Meds ordered this encounter  Medications   semaglutide -weight management (WEGOVY ) 0.5 MG/0.5ML SOAJ SQ injection    Sig: Inject 0.5 mg into the skin  once a week.    Dispense:  2 mL    Refill:  2   amoxicillin -clavulanate (AUGMENTIN ) 875-125 MG tablet    Sig: Take 1 tablet by mouth 2 (two) times daily.    Dispense:  20 tablet    Refill:  0   fluticasone  (FLONASE ) 50 MCG/ACT nasal spray    Sig: Place 2 sprays into both nostrils daily.    Dispense:  16 g    Refill:  6   Saline (SIMPLY SALINE) 0.9 % AERS    Sig: Place 2 each into the nose as directed. Use nightly for sinus hygiene long-term.  Can also be used as many times daily as desired to assist with clearing congested sinuses.    Dispense:  127 mL    Refill:  11   loratadine  (CLARITIN ) 10 MG tablet    Sig: Take 1 tablet (10 mg total) by mouth daily.    Dispense:  30 tablet    Refill:  11   pseudoephedrine  (SUDAFED 12 HOUR) 120 MG 12 hr tablet    Sig: Take 1 tablet (120 mg total) by mouth 2 (two) times daily.    Dispense:  20 tablet    Refill:  0   semaglutide -weight management (WEGOVY ) 0.5 MG/0.5ML SOAJ SQ injection    Sig: Inject 0.5 mg into the skin once a week.    Dispense:  2 mL    Refill:  2   ondansetron  (ZOFRAN -ODT) 4 MG disintegrating tablet  Sig: Take 1 tablet (4 mg total) by mouth every 8 (eight) hours as needed for nausea or vomiting.    Dispense:  20 tablet    Refill:  2         This document was synthesized by artificial intelligence ([employer]) using HIPAA-compliant recording of the clinical interaction;   We discussed the use of AI scribe software for clinical note transcription with the patient, who gave verbal consent to proceed. additional Info: This encounter employed state-of-the-art, real-time, collaborative documentation. The patient actively reviewed and assisted in updating their electronic medical record on a shared screen, ensuring transparency and facilitating joint problem-solving for the problem list, overview, and plan. This approach promotes accurate, informed care. The treatment plan was discussed and reviewed in detail, including medication  safety, potential side effects, and all patient questions. We confirmed understanding and comfort with the plan. Follow-up instructions were established, including contacting the office for any concerns, returning if symptoms worsen, persist, or new symptoms develop, and precautions for potential emergency department visits.

## 2024-03-19 ENCOUNTER — Encounter (INDEPENDENT_AMBULATORY_CARE_PROVIDER_SITE_OTHER): Payer: Self-pay

## 2024-03-29 ENCOUNTER — Ambulatory Visit: Admitting: Internal Medicine

## 2024-04-01 ENCOUNTER — Encounter: Payer: Self-pay | Admitting: Internal Medicine

## 2024-04-01 ENCOUNTER — Encounter (INDEPENDENT_AMBULATORY_CARE_PROVIDER_SITE_OTHER): Payer: Self-pay

## 2024-04-01 DIAGNOSIS — H9201 Otalgia, right ear: Secondary | ICD-10-CM

## 2024-04-01 DIAGNOSIS — G8929 Other chronic pain: Secondary | ICD-10-CM | POA: Insufficient documentation

## 2024-04-08 ENCOUNTER — Ambulatory Visit: Admitting: Internal Medicine

## 2024-04-26 ENCOUNTER — Telehealth: Admitting: Emergency Medicine

## 2024-04-26 DIAGNOSIS — K0889 Other specified disorders of teeth and supporting structures: Secondary | ICD-10-CM

## 2024-04-26 MED ORDER — PENICILLIN V POTASSIUM 500 MG PO TABS: 500.0000 mg | ORAL_TABLET | Freq: Three times a day (TID) | ORAL | 0 refills | Status: AC

## 2024-04-26 NOTE — Progress Notes (Signed)
 E-Visit for Dental Pain  We are sorry that you are not feeling well.  Here is how we plan to help!  Based on what you have shared with me in the questionnaire, it sounds like you have a dental infection  Pen VK 500mg  3 times a day for 7 days  It is imperative that you see a dentist within 10 days of this eVisit to determine the cause of the dental pain and be sure it is adequately treated  A toothache or tooth pain is caused when the nerve in the root of a tooth or surrounding a tooth is irritated. Dental (tooth) infection, decay, injury, or loss of a tooth are the most common causes of dental pain. Pain may also occur after an extraction (tooth is pulled out). Pain sometimes originates from other areas and radiates to the jaw, thus appearing to be tooth pain.Bacteria growing inside your mouth can contribute to gum disease and dental decay, both of which can cause pain. A toothache occurs from inflammation of the central portion of the tooth called pulp. The pulp contains nerve endings that are very sensitive to pain. Inflammation to the pulp or pulpitis may be caused by dental cavities, trauma, and infection.    HOME CARE:   For toothaches: Over-the-counter pain medications such as acetaminophen  or ibuprofen  may be used. Take these as directed on the package while you arrange for a dental appointment. Avoid very cold or hot foods, because they may make the pain worse. You may get relief from biting on a cotton ball soaked in oil of cloves. You can get oil of cloves at most drug stores.  For jaw pain:  Aspirin may be helpful for problems in the joint of the jaw in adults. If pain happens every time you open your mouth widely, the temporomandibular joint (TMJ) may be the source of the pain. Yawning or taking a large bite of food may worsen the pain. An appointment with your doctor or dentist will help you find the cause.     GET HELP RIGHT AWAY IF:  You have a high fever or chills If you  have had a recent head or face injury and develop headache, light headedness, nausea, vomiting, or other symptoms that concern you after an injury to your face or mouth, you could have a more serious injury in addition to your dental injury. A facial rash associated with a toothache: This condition may improve with medication. Contact your doctor for them to decide what is appropriate. Any jaw pain occurring with chest pain: Although jaw pain is most commonly caused by dental disease, it is sometimes referred pain from other areas. People with heart disease, especially people who have had stents placed, people with diabetes, or those who have had heart surgery may have jaw pain as a symptom of heart attack or angina. If your jaw or tooth pain is associated with lightheadedness, sweating, or shortness of breath, you should see a doctor as soon as possible. Trouble swallowing or excessive pain or bleeding from gums: If you have a history of a weakened immune system, diabetes, or steroid use, you may be more susceptible to infections. Infections can often be more severe and extensive or caused by unusual organisms. Dental and gum infections in people with these conditions may require more aggressive treatment. An abscess may need draining or IV antibiotics, for example.  MAKE SURE YOU   Understand these instructions. Will watch your condition. Will get help right away if  you are not doing well or get worse.  Thank you for choosing an e-visit.  Your e-visit answers were reviewed by a board certified advanced clinical practitioner to complete your personal care plan. Depending upon the condition, your plan could have included both over the counter or prescription medications.  Please review your pharmacy choice. Make sure the pharmacy is open so you can pick up prescription now. If there is a problem, you may contact your provider through [employer] and have the prescription routed to another  pharmacy.  Your safety is important to us . If you have drug allergies check your prescription carefully.   For the next 24 hours you can use MyChart to ask questions about today's visit, request a non-urgent call back, or ask for a work or school excuse. You will get an email in the next two days asking about your experience. I hope that your e-visit has been valuable and will speed your recovery.  I have spent 5 minutes in review of e-visit questionnaire, review and updating patient chart, medical decision making and response to patient.   Jon Belt, PhD, FNP-BC

## 2024-04-27 ENCOUNTER — Encounter: Payer: Self-pay | Admitting: Internal Medicine

## 2024-04-27 DIAGNOSIS — K5903 Drug induced constipation: Secondary | ICD-10-CM

## 2024-04-27 MED ORDER — DOCUSATE SODIUM 100 MG PO CAPS: 100.0000 mg | ORAL_CAPSULE | Freq: Two times a day (BID) | ORAL | 4 refills | Status: AC

## 2024-04-27 MED ORDER — POLYETHYLENE GLYCOL 3350 17 GM/SCOOP PO POWD: 17.0000 g | Freq: Two times a day (BID) | ORAL | 1 refills | Status: AC | PRN

## 2024-04-28 ENCOUNTER — Ambulatory Visit (INDEPENDENT_AMBULATORY_CARE_PROVIDER_SITE_OTHER)

## 2024-04-28 VITALS — HR 83

## 2024-04-28 DIAGNOSIS — H608X3 Other otitis externa, bilateral: Secondary | ICD-10-CM | POA: Diagnosis not present

## 2024-04-28 DIAGNOSIS — H9203 Otalgia, bilateral: Secondary | ICD-10-CM | POA: Diagnosis not present

## 2024-04-28 NOTE — Progress Notes (Signed)
 Dear Dr. Jesus, Here is my assessment for our mutual patient, Caitlin Patrick. Thank you for allowing me the opportunity to care for your patient. Please do not hesitate to contact me should you have any other questions. Sincerely, Dr. Hadassah Parody  Otolaryngology Clinic Note Referring provider: Dr. Jesus HPI:   Initial HPI (04/28/2024) 39 year old female with a history of recurrent acute external sense for intermittent ear pain and concern for ear infection.  Intermittent ear pain for the last 2 years typically November and December.  More recently has had more episodes of acute pain but these dissipate quickly.  pain is sudden onset tender to palpation at times around her ear, can sometimes involve her jaw on that side.  Most recent pain episode occurred approximately 2 weeks ago.  She has no persistent otalgia or pruritus present.  Was told at her PCP she has purulence present and so she wanted this further evaluated.  She had a recent episode of otitis externa on the right side which caused swelling around the ear and face.  This has now resolved.  She safely cleans her ear with a washcloth and a Q-tip only on the outer edge of her ear, never places anything inside the ear canal.   Independent Review of Additional Tests or Records:  Referral note 03/17/2024 Bernardino Jesus, MD: History of recurrent ear infections.  Thinks she has poor sinus drainage.  Given Augmentin  Flonase  saline rinses and loratadine .  Refer to ENT  TSH 12/23/2023 1.820  Hemoglobin A1c 12/23/2023 5.9   PMH/Meds/All/SocHx/FamHx/ROS:   Past Medical History:  Diagnosis Date   DVT (deep venous thrombosis) (HCC)    Sickle cell trait    Uterine leiomyoma 04/29/2022   Vaginal Pap smear, abnormal      Past Surgical History:  Procedure Laterality Date   CARPAL TUNNEL RELEASE Right 05/01/2021   Procedure: RIGHT CARPAL TUNNEL RELEASE;  Surgeon: Murrell Drivers, MD;  Location: [facility];  Service:  Orthopedics;  Laterality: Right;   CESAREAN SECTION     MULTIPLE TOOTH EXTRACTIONS Left    WISDOM TOOTH EXTRACTION      Family History  Problem Relation Age of Onset   Breast cancer Mother    Cancer Mother    Hypertension Father    Diabetes Maternal Grandmother    Diabetes Paternal Grandmother      Social Connections: Moderately Isolated (02/14/2023)   Social Connection and Isolation Panel    Frequency of Communication with Friends and Family: More than three times a week    Frequency of Social Gatherings with Friends and Family: Once a week    Attends Religious Services: Never    Database Administrator or Organizations: No    Attends Engineer, Structural: Not on file    Marital Status: Living with partner     Current Outpatient Medications  Medication Instructions   busPIRone  (BUSPAR ) 5 mg, Oral, 2 times daily   docusate sodium  (COLACE) 100 mg, Oral, 2 times daily   fluticasone  (FLONASE ) 50 MCG/ACT nasal spray 2 sprays, Each Nare, Daily   hydrOXYzine (VISTARIL) 25 mg, Oral, Every 8 hours PRN   loratadine  (CLARITIN ) 10 mg, Oral, Daily   metFORMIN  (GLUCOPHAGE ) 500 mg, Oral, 2 times daily with meals   ondansetron  (ZOFRAN -ODT) 4 mg, Oral, Every 8 hours PRN   penicillin  v potassium (VEETID) 500 mg, Oral, 3 times daily   polyethylene glycol powder (GLYCOLAX /MIRALAX ) 17 g, Oral, 2 times daily PRN, Dissolve 1 capful (17g) in 4-8 ounces  of liquid and take by mouth daily.   pseudoephedrine  (SUDAFED 12 HOUR) 120 mg, Oral, 2 times daily   Saline (SIMPLY SALINE) 0.9 % AERS 2 each, Nasal, As directed, Use nightly for sinus hygiene long-term.  Can also be used as many times daily as desired to assist with clearing congested sinuses.   Wegovy  0.5 mg, Subcutaneous, Weekly   Wegovy  0.5 mg, Subcutaneous, Weekly     Physical Exam:   Pulse 83   SpO2 98%   Salient findings:  CN II-XII intact   Given history and complaints, ear microscopy was indicated and performed for evaluation  with findings as below in physical exam section and in procedures Bilateral EAC clear.  Bilateral EAC dry with no cerumen present.  No infection noted.  Bilateral TM intact with well pneumatized middle ear spaces  Anterior rhinoscopy: Septum midline anteriorly, healthy appearing; bilateral inferior turbinates with hypertrophy  No lesions of oral cavity/oropharynx  No obviously palpable neck masses/lymphadenopathy/thyromegaly  No respiratory distress or stridor  Seprately Identifiable Procedures:  Prior to initiating any procedures, risks/benefits/alternatives were explained to the patient and verbal consent obtained.  Procedure (04/28/2024): Bilateral ear microscopy using microscope (CPT [id number]) Pre-procedure diagnosis: otalgia Post-procedure diagnosis: same Indication: see above; given patient's otologic complaints and history, for improved and comprehensive examination of external ear and tympanic membrane, bilateral otologic examination using microscope was performed. Prior to proceeding, verbal consent was obtained after discussion of R/B/A  Procedure: Patient was placed semi-recumbent. Both ear canals were examined using the microscope with findings above. Patient tolerated the procedure well.   Impression & Plans:  Caitlin Patrick is a 39 y.o. female with   1. Otalgia of both ears   2. 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 Currently, her ear canals are dry and susceptible but without active infection, inflammation.  No evidence of otitis media. Dryness and minor trauma increase her risk for future infections. - Ear canals currently show signs of mild external otitis and dryness but no significant infection requiring treatment at this  time - Explained that intermittent otalgia may result from canal dryness or jaw clenching; no evidence of middle ear pathology. - No need for prescription eardrops at this time.  Could consider mineral oil instillation in the ear canals as needed for lubrication. - Discussed that topical otic drops may be prescribed if she develops increased otalgia or pruritus in the future. - Advised prompt clinic contact for symptoms suggestive of infection to facilitate timely evaluation and management. - Reinforced avoidance of inserting objects into the ear canal to prevent trauma.  See below regarding exact medications prescribed this encounter including dosages and route: No orders of the defined types were placed in this encounter.   Thank you for allowing me the opportunity to care for your patient. Please do not hesitate to contact me should you have any other questions.  Sincerely, Hadassah Parody, MD Otolaryngologist (ENT), [facility] Phone: [phone] Fax: [phone]  MDM:  Level 4 Complexity/Problems addressed: 4-chronic problem with exacerbation Data complexity: 4-  independent review of referral note, 2 labs - Morbidity: -   - Prescription Drug prescribed or managed: no'

## 2024-04-29 ENCOUNTER — Encounter: Admitting: Dietician
